# Patient Record
Sex: Male | Born: 1940 | Race: White | Hispanic: No | Marital: Married | Smoking: Never smoker
Health system: Southern US, Community
[De-identification: ages and names within clinical notes are randomized; demographics above are authoritative.]

## PROBLEM LIST (undated history)

## (undated) DIAGNOSIS — E785 Hyperlipidemia, unspecified: Secondary | ICD-10-CM

## (undated) DIAGNOSIS — E669 Obesity, unspecified: Secondary | ICD-10-CM

## (undated) DIAGNOSIS — I2510 Atherosclerotic heart disease of native coronary artery without angina pectoris: Secondary | ICD-10-CM

## (undated) DIAGNOSIS — E119 Type 2 diabetes mellitus without complications: Secondary | ICD-10-CM

## (undated) DIAGNOSIS — R0602 Shortness of breath: Secondary | ICD-10-CM

## (undated) HISTORY — DX: Obesity, unspecified: E66.9

## (undated) HISTORY — PX: CORONARY ARTERY BYPASS GRAFT: SHX141

## (undated) HISTORY — DX: Type 2 diabetes mellitus without complications: E11.9

## (undated) HISTORY — DX: Atherosclerotic heart disease of native coronary artery without angina pectoris: I25.10

## (undated) HISTORY — DX: Hyperlipidemia, unspecified: E78.5

## (undated) HISTORY — PX: HERNIA REPAIR: SHX51

## (undated) HISTORY — DX: Shortness of breath: R06.02

---

## 2010-03-19 ENCOUNTER — Ambulatory Visit (HOSPITAL_COMMUNITY)
Admission: RE | Admit: 2010-03-19 | Discharge: 2010-03-19 | Payer: Self-pay | Source: Home / Self Care | Attending: Cardiology | Admitting: Cardiology

## 2010-03-21 NOTE — Procedures (Signed)
[name], BILLER NO.:  [id number]  MEDICAL RECORD NO.:  [id number]          PATIENT TYPE:  OIB  LOCATION:  2899                         FACILITY:  [facility]  PHYSICIAN:  Jake Bathe, MD      DATE OF BIRTH:  [birth year]  DATE OF PROCEDURE:  03/19/2010 DATE OF DISCHARGE:                           CARDIAC CATHETERIZATION   PROCEDURE:  Left heart catheterization via the right radial artery approach.  Selective coronary angiography, left ventriculogram.  INDICATIONS:  A 70 year old male with prior cardiac catheterization in 1997 showing mild nonobstructive CAD who recently underwent a nuclear stress test showing a moderate-size moderately reversible defect in the inferior wall concerning for ischemia.  He exercised for 5 minutes and 35 seconds.  Occasional PVCs were noted.  Nonspecific ST-segment changes were noted.  In 3 minutes in the recovery,  his ST-segments, however, became horizontal and 1 mm in depression.  Symptoms have been increasing dyspnea on exertion while pulling his garbage can in for instance.  He also describes a burning sensation in his neck with exertion.  He is clearly winded when playing with his grandchildren.  Past medical history include obesity.  Medications are currently none.  Aspirin will be added as well as sublingual nitroglycerin.  PROCEDURE DETAILS:  Informed consent was obtained.  Risk of stroke, heart attack, death, renal impairment, arterial damage, and bleeding were explained to the patient at length.  Prepped and draped in sterile fashion.  1% lidocaine was used for local anesthesia to the right radial artery wrist site.  2 mg of Versed, 50 mcg of fentanyl were used for conscious sedation.  A 5-French hydrophilic sheath was inserted in the right radial artery without any difficulty.  A Wholey wire was then used to traverse the aortic arch with a Judkins right #4 catheter and 4000 units of heparin were administered.  3 mg  of verapamil was also administered via sheath.  Multiple views with hand injection of Omnipaque of the right coronary artery were obtained after selective cannulization.  This catheter was then exchanged over a safety J-wire for a Judkins left 3.5 catheter and multiple views with hand injection of Omnipaque were obtained of the left system.  An angled pigtail was used to cross in the left ventricle, and a left ventriculogram in the RAO position utilizing 30 mL of contrast was performed with power injection.  Following the procedure, sheath was removed, catheters were removed without difficulty, and he was hemodynamically stable.  No chest pain.  FINDINGS: 1. Right coronary artery - this vessel does demonstrate a mid highly     calcified lesion of up to 99% with a small ectatic/aneurysmal     segment proximal to the 99% lesion.  He also has approximately 50%     lesion prior to the ectatic segment.  There are minor luminal     irregularities until the split of the posterior descending artery.     The PDA fills late in injection and Marion Downer has what appears to be     a significant subtotaled lesion.  There are collaterals supplying     the PDA from  the left system. 2. Left main artery - widely patent.  No significant disease, branches     into the LAD and circumflex system. 3. LAD - there are two diagonal branches.  The first of which is     moderate size and caliber.  The first diagonal branch appears to     have an 80% ostial lesion.  In the mid LAD segment, there is a     region of calcification after a large septal branch that in the LAO     cranial view appears to be hemodynamically significant,     approximately 70%. 4. Circumflex artery.  This vessel has diffuse disease.  The ostial     circumflex has a lesion of approximately 80%, seen best on the LAO     caudal view.  There are two significant obtuse marginal branches.     The first of which is moderate-sized and caliber and  contains no     significant disease.  The second obtuse marginal branch is small in     caliber.  Between the first and second obtuse marginal branch,     there is a long tubular stenosis, likely subtotaled circumflex 99%.     Once again, RCA collaterals/PDA collaterals are seen from the     septal branches. 5. Left ventriculogram.  Normal left ventricular ejection fraction of     65% with no wall motion abnormalities.  I do not appreciate any     inferior wall motion abnormality on this study.  No significant     mitral regurgitation present.  HEMODYNAMICS:  Left ventricular systolic pressure 124 with an end- diastolic pressure of 11.  Aortic pressure 124/70 with a mean of 91 mmHg - no gradient.  IMPRESSION:  Severe three-vessel coronary artery disease, most notably right coronary artery/posterior descending artery with subtotaled posterior descending artery filling via right to right/left to right collaterals, subtotaled 99%.  Circumflex between the obtuse marginal 1 and 2, and a 70% mid left anterior descending lesion calcified at the area of a large septal branch.  These findings were discussed with patient as well as Dr. Katrinka Blazing and we felt that best chance at revascularization was with bypass surgery.  I have consulted [facility] and they will set him up for an outpatient consultation.  He is having no active chest pain.  No discomfort.  He tolerated the procedure very well.  I feel comfortable with him receiving an outpatient evaluation.  We will go ahead and place him on aspirin, give him nitroglycerin, and also place him on a low-dose beta blocker.     Jake Bathe, MD     MCS/MEDQ  D:  03/19/2010  T:  03/19/2010  Job:  [id number]  Electronically Signed by Donato Schultz MD on 03/21/2010 01:04:44 PM

## 2010-03-24 ENCOUNTER — Ambulatory Visit
Admission: RE | Admit: 2010-03-24 | Discharge: 2010-03-24 | Payer: Self-pay | Source: Home / Self Care | Attending: Surgery | Admitting: Surgery

## 2010-03-25 ENCOUNTER — Other Ambulatory Visit: Payer: Self-pay | Admitting: Surgery

## 2010-03-25 ENCOUNTER — Encounter (HOSPITAL_COMMUNITY)
Admission: RE | Admit: 2010-03-25 | Discharge: 2010-03-25 | Disposition: A | Discharge status: Home / Self Care | Payer: Medicare Other | Source: Ambulatory Visit | Attending: Surgery | Admitting: Surgery

## 2010-03-25 ENCOUNTER — Inpatient Hospital Stay (HOSPITAL_COMMUNITY)
Admission: RE | Admit: 2010-03-25 | Discharge: 2010-03-25 | Disposition: A | Discharge status: Home / Self Care | Payer: MEDICARE | Source: Ambulatory Visit | Attending: Surgery | Admitting: Surgery

## 2010-03-25 ENCOUNTER — Encounter (HOSPITAL_COMMUNITY): Payer: Medicare Other

## 2010-03-25 DIAGNOSIS — Z01818 Encounter for other preprocedural examination: Secondary | ICD-10-CM | POA: Insufficient documentation

## 2010-03-25 DIAGNOSIS — I2510 Atherosclerotic heart disease of native coronary artery without angina pectoris: Secondary | ICD-10-CM

## 2010-03-25 DIAGNOSIS — Z01810 Encounter for preprocedural cardiovascular examination: Secondary | ICD-10-CM

## 2010-03-25 NOTE — Consult Note (Signed)
NEW PATIENT CONSULTATION  [name], [name] DOB:  08-Nov-1940                                        March 24, 2010 CHART #:  [id number]  REFERRING PHYSICIAN:  Veverly Fells. Skains, MD  REASON FOR CONSULTATION:  Severe three-vessel coronary disease with markedly abnormal stress test.  CLINICAL HISTORY:  Assessed by Dr. Anne Fu, to evaluate the patient for consideration of coronary bypass graft surgery.  He is a 70 year old gentleman with a history of nonobstructive coronary disease by catheterization about 15 years ago who does not seek regular medical care.  According to his wife over the past several months, he has had progressive exertional dyspnea as well as a burning in his throat occurring with exertion.  He has noticed this with taking his trash cans out as well as playing baseball with his grandson.  He said he really was too concerned about the symptoms but his family made him come to the doctor.  He underwent a nuclear stress test recently showing moderate- sized reversible defect in the inferior wall concerning for ischemia. He underwent cardiac catheterization on March 19, 2010, which showed severe three-vessel coronary disease.  The right coronary artery had a calcified 99% midvessel stenosis.  This was a large dominant vessel that gave off a posterior descending and posterolateral branch.  The posterior descending itself appeared to have a high-grade ostial lesion with slow and delayed filling of that vessel.  The left main coronary artery was widely patent.  The LAD had two diagonal branches that were small.  The first diagonal had about 80% ostial stenosis.  The LAD had a 70% midvessel stenosis.  Left circumflex was diffusely diseased and a relatively small vessel.  There is 80% ostial narrowing.  There were two obtuse marginal branches.  The first was a small and moderate-sized vessel that quickly decreased in size.  He had no significant  disease. The second marginal was small and there was a long tubular stenosis of about 99% between the two marginal branches.  Left ventricular ejection fraction of 65% with no wall motion abnormalities.  There was no mitral regurgitation.  REVIEW OF SYSTEMS:  As follows: GENERAL:  He denies any fever or chills.  He has had no recent weight changes.  He denies fatigue, although he said he has been decreasing activity due to his symptoms. EYES:  Negative. ENT:  Negative. ENDOCRINE:  He denies diabetes and hypothyroidism. CARDIOVASCULAR:  As above.  He denies PND and orthopnea.  He has had no peripheral edema or palpitations. RESPIRATORY:  He denies cough and sputum production. GI:  He has had no nausea or vomiting.  He denies any melena or bright red blood per rectum. GU:  He denies dysuria and hematuria. MUSCULOSKELETAL:  He denies arthralgias and myalgias. NEUROLOGICAL:  He has had some headaches.  He denies any focal weakness or numbness.  Denies dizziness and syncope.  He has never had TIA or stroke. PSYCHIATRIC:  Negative. HEMATOLOGICAL:  Negative.  ALLERGIES:  None.  His medications are aspirin 325 mg daily and sublingual nitroglycerin p.Haley.n.  His past medical history is significant for prior hernia repair surgery. He denies any other medical illnesses.  He did have a stress test about 15 years ago which he said was abnormal and he subsequently had a cardiac catheterization in 1997, which showed no significant coronary  disease.  He reported about 20% narrowing in one vessel.  He was subsequently followed by Dr. Corliss Marcus until recently.  Family history is negative for cardiac disease.  There is a history of lung cancer in his father who is a smoker.  SOCIAL HISTORY:  He was a previous smoker, who quit in 1974.  He denies alcohol use.  He is retired.  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 He is  married and lives with his wife.  PHYSICAL EXAMINATION:  General:  He is a well-developed white male in no distress.  Vital Signs:  Blood pressure 160/80, pulse 60 and regular, respiratory rate is 20 and unlabored.  Oxygen saturation on room air is 97%.  HEENT exam shows to be normocephalic and atraumatic.  Pupils are equal and reactive to light and accommodation.  Extraocular muscles are intact.  His throat is clear.  Neck exam shows normal carotid pulses bilaterally.  There are no bruits.  There is no adenopathy or thyromegaly.  Cardiac exam shows regular rate and rhythm with normal S1 and S2.  There is no murmur, rub, or gallop.  His lungs are clear. Abdominal exam shows active bowel sounds.  His abdomen is soft, obese, and nontender.  There are no palpable masses or organomegaly. Extremities exam show no peripheral edema.  Pedal pulses are palpable bilaterally.  Skin is warm and dry.  Neurologic exam shows be alert and oriented x3.  Motor and sensory exam is grossly normal.  IMPRESSION:  The patient has severe three-vessel coronary artery disease with worsening exertional anginal symptoms.  I agree that coronary artery bypass graft surgery is the best treatment to prevent further ischemia and infarction and improves the quality of life.  I discussed the operative procedure with he and his wife including alternatives, benefits, and risks including, but not limited to bleeding, blood transfusion, infection, stroke, myocardial infarction, graft failure, and death.  He understands all of this and agrees to proceed.  We will plan to do this on Friday, March 27, 2010.  Evelene Croon, M.D. Electronically Signed  BB/MEDQ  D:  03/24/2010  T:  03/25/2010  Job:  [id number]  cc:   Jake Bathe, MD

## 2010-03-27 ENCOUNTER — Inpatient Hospital Stay (HOSPITAL_COMMUNITY): Payer: Medicare Other

## 2010-03-27 ENCOUNTER — Inpatient Hospital Stay (HOSPITAL_COMMUNITY)
Admission: RE | Admit: 2010-03-27 | Discharge: 2010-03-31 | DRG: 236 | Disposition: A | Discharge status: Home Health | Payer: Medicare Other | Source: Ambulatory Visit | Attending: Surgery | Admitting: Surgery

## 2010-03-27 DIAGNOSIS — N289 Disorder of kidney and ureter, unspecified: Secondary | ICD-10-CM | POA: Diagnosis present

## 2010-03-27 DIAGNOSIS — I2510 Atherosclerotic heart disease of native coronary artery without angina pectoris: Secondary | ICD-10-CM

## 2010-03-27 DIAGNOSIS — D62 Acute posthemorrhagic anemia: Secondary | ICD-10-CM | POA: Diagnosis not present

## 2010-03-27 DIAGNOSIS — I10 Essential (primary) hypertension: Secondary | ICD-10-CM | POA: Diagnosis present

## 2010-03-27 DIAGNOSIS — D696 Thrombocytopenia, unspecified: Secondary | ICD-10-CM | POA: Diagnosis not present

## 2010-03-27 DIAGNOSIS — Z7982 Long term (current) use of aspirin: Secondary | ICD-10-CM

## 2010-03-27 DIAGNOSIS — Z87891 Personal history of nicotine dependence: Secondary | ICD-10-CM

## 2010-03-27 LAB — CBC
HCT: 38.3 % — ABNORMAL LOW (ref 39.0–52.0)
HCT: 30.3 % — ABNORMAL LOW (ref 39.0–52.0)
Hemoglobin: 13.3 g/dL (ref 13.0–17.0)
Hemoglobin: 10.4 g/dL — ABNORMAL LOW (ref 13.0–17.0)
MCH: 28.5 pg (ref 26.0–34.0)
MCHC: 34.7 g/dL (ref 30.0–36.0)
MCHC: 33.8 g/dL (ref 30.0–36.0)
MCV: 84 fL (ref 78.0–100.0)
MCV: 84.3 fL (ref 78.0–100.0)
Platelets: 128 10*3/uL — ABNORMAL LOW (ref 150–400)
RDW: 13.6 % (ref 11.5–15.5)
WBC: 4.5 10*3/uL (ref 4.0–10.5)
WBC: 6.6 10*3/uL (ref 4.0–10.5)

## 2010-03-27 LAB — POCT I-STAT 3, ART BLOOD GAS (G3+)
Bicarbonate: 26.4 mEq/L — ABNORMAL HIGH (ref 20.0–24.0)
Bicarbonate: 24.3 mEq/L — ABNORMAL HIGH (ref 20.0–24.0)
O2 Saturation: 100 %
O2 Saturation: 100 %
TCO2: 28 mmol/L (ref 0–100)
pCO2 arterial: 39.3 mmHg (ref 35.0–45.0)
pCO2 arterial: 48.3 mmHg — ABNORMAL HIGH (ref 35.0–45.0)
pH, Arterial: 7.37 (ref 7.350–7.450)
pH, Arterial: 7.373 (ref 7.350–7.450)
pH, Arterial: 7.353 (ref 7.350–7.450)
pO2, Arterial: 92 mmHg (ref 80.0–100.0)
pO2, Arterial: 104 mmHg — ABNORMAL HIGH (ref 80.0–100.0)

## 2010-03-27 LAB — APTT
aPTT: 29 seconds (ref 24–37)
aPTT: 33 seconds (ref 24–37)

## 2010-03-27 LAB — COMPREHENSIVE METABOLIC PANEL
ALT: 54 U/L — ABNORMAL HIGH (ref 0–53)
Alkaline Phosphatase: 58 U/L (ref 39–117)
BUN: 17 mg/dL (ref 6–23)
CO2: 22 mEq/L (ref 19–32)
Calcium: 9.2 mg/dL (ref 8.4–10.5)
GFR calc non Af Amer: >60 mL/min (ref >60)
Glucose, Bld: 121 mg/dL — ABNORMAL HIGH (ref 70–99)
Potassium: 4.3 mEq/L (ref 3.5–5.1)
Sodium: 138 mEq/L (ref 135–145)
Total Protein: 7.2 g/dL (ref 6.0–8.3)

## 2010-03-27 LAB — BLOOD GAS, ARTERIAL
Acid-Base Excess: 0.3 mmol/L (ref 0.0–2.0)
Bicarbonate: 24.8 mEq/L — ABNORMAL HIGH (ref 20.0–24.0)
Patient temperature: 98.6
TCO2: 26.2 mmol/L (ref 0–100)

## 2010-03-27 LAB — GLUCOSE, CAPILLARY
Glucose-Capillary: 126 mg/dL — ABNORMAL HIGH (ref 70–99)
Glucose-Capillary: 124 mg/dL — ABNORMAL HIGH (ref 70–99)
Glucose-Capillary: 112 mg/dL — ABNORMAL HIGH (ref 70–99)
Glucose-Capillary: 131 mg/dL — ABNORMAL HIGH (ref 70–99)
Glucose-Capillary: 146 mg/dL — ABNORMAL HIGH (ref 70–99)

## 2010-03-27 LAB — POCT I-STAT 4, (NA,K, GLUC, HGB,HCT)
Glucose, Bld: 132 mg/dL — ABNORMAL HIGH (ref 70–99)
Glucose, Bld: 115 mg/dL — ABNORMAL HIGH (ref 70–99)
Glucose, Bld: 139 mg/dL — ABNORMAL HIGH (ref 70–99)
Glucose, Bld: 152 mg/dL — ABNORMAL HIGH (ref 70–99)
HCT: 26 % — ABNORMAL LOW (ref 39.0–52.0)
HCT: 27 % — ABNORMAL LOW (ref 39.0–52.0)
HCT: 26 % — ABNORMAL LOW (ref 39.0–52.0)
Hemoglobin: 11.9 g/dL — ABNORMAL LOW (ref 13.0–17.0)
Hemoglobin: 8.8 g/dL — ABNORMAL LOW (ref 13.0–17.0)
Hemoglobin: 9.2 g/dL — ABNORMAL LOW (ref 13.0–17.0)
Hemoglobin: 9.2 g/dL — ABNORMAL LOW (ref 13.0–17.0)
Hemoglobin: 10.9 g/dL — ABNORMAL LOW (ref 13.0–17.0)
Potassium: 5.5 mEq/L — ABNORMAL HIGH (ref 3.5–5.1)
Potassium: 6.4 mEq/L (ref 3.5–5.1)
Potassium: 7 mEq/L (ref 3.5–5.1)
Potassium: 5.5 mEq/L — ABNORMAL HIGH (ref 3.5–5.1)
Sodium: 141 mEq/L (ref 135–145)
Sodium: 138 mEq/L (ref 135–145)
Sodium: 134 mEq/L — ABNORMAL LOW (ref 135–145)
Sodium: 140 mEq/L (ref 135–145)

## 2010-03-27 LAB — POCT I-STAT, CHEM 8
BUN: 17 mg/dL (ref 6–23)
Calcium, Ion: 1.11 mmol/L — ABNORMAL LOW (ref 1.12–1.32)
Creatinine, Ser: 1.1 mg/dL (ref 0.4–1.5)
Glucose, Bld: 163 mg/dL — ABNORMAL HIGH (ref 70–99)
Hemoglobin: 10.9 g/dL — ABNORMAL LOW (ref 13.0–17.0)
TCO2: 24 mmol/L (ref 0–100)

## 2010-03-27 LAB — HEMOGLOBIN AND HEMATOCRIT, BLOOD
HCT: 25.8 % — ABNORMAL LOW (ref 39.0–52.0)
Hemoglobin: 9.1 g/dL — ABNORMAL LOW (ref 13.0–17.0)

## 2010-03-27 LAB — TYPE AND SCREEN
ABO/RH(D): A NEG
ABO/RH(D): A NEG
Antibody Screen: NEGATIVE
Unit division: 0

## 2010-03-27 LAB — URINALYSIS, ROUTINE W REFLEX MICROSCOPIC
Bilirubin Urine: NEGATIVE
Hgb urine dipstick: NEGATIVE
Ketones, ur: NEGATIVE mg/dL
Nitrite: NEGATIVE
Specific Gravity, Urine: 1.021 (ref 1.005–1.030)
Urobilinogen, UA: 0.2 mg/dL (ref 0.0–1.0)

## 2010-03-27 LAB — CREATININE, SERUM: GFR calc Af Amer: >60 mL/min (ref >60)

## 2010-03-27 LAB — MAGNESIUM: Magnesium: 2.6 mg/dL — ABNORMAL HIGH (ref 1.5–2.5)

## 2010-03-27 LAB — PROTIME-INR
INR: 1.03 (ref 0.00–1.49)
INR: 1.31 (ref 0.00–1.49)

## 2010-03-27 LAB — PLATELET COUNT: Platelets: 120 10*3/uL — ABNORMAL LOW (ref 150–400)

## 2010-03-28 ENCOUNTER — Inpatient Hospital Stay (HOSPITAL_COMMUNITY): Payer: Medicare Other

## 2010-03-28 LAB — GLUCOSE, CAPILLARY
Glucose-Capillary: 135 mg/dL — ABNORMAL HIGH (ref 70–99)
Glucose-Capillary: 150 mg/dL — ABNORMAL HIGH (ref 70–99)
Glucose-Capillary: 156 mg/dL — ABNORMAL HIGH (ref 70–99)
Glucose-Capillary: 174 mg/dL — ABNORMAL HIGH (ref 70–99)

## 2010-03-28 LAB — CBC
HCT: 29.4 % — ABNORMAL LOW (ref 39.0–52.0)
MCH: 28.8 pg (ref 26.0–34.0)
MCH: 29.1 pg (ref 26.0–34.0)
MCHC: 34 g/dL (ref 30.0–36.0)
MCV: 85.5 fL (ref 78.0–100.0)
Platelets: 138 10*3/uL — ABNORMAL LOW (ref 150–400)
Platelets: 113 10*3/uL — ABNORMAL LOW (ref 150–400)
RBC: 3.44 MIL/uL — ABNORMAL LOW (ref 4.22–5.81)
RDW: 14 % (ref 11.5–15.5)
WBC: 8.8 10*3/uL (ref 4.0–10.5)

## 2010-03-28 LAB — BASIC METABOLIC PANEL
BUN: 19 mg/dL (ref 6–23)
Chloride: 107 mEq/L (ref 96–112)
Creatinine, Ser: 1.47 mg/dL (ref 0.4–1.5)
Glucose, Bld: 185 mg/dL — ABNORMAL HIGH (ref 70–99)
Potassium: 4.7 mEq/L (ref 3.5–5.1)

## 2010-03-28 LAB — CREATININE, SERUM
Creatinine, Ser: 1.44 mg/dL (ref 0.4–1.5)
GFR calc Af Amer: 59 mL/min — ABNORMAL LOW (ref >60)

## 2010-03-29 ENCOUNTER — Inpatient Hospital Stay (HOSPITAL_COMMUNITY): Payer: Medicare Other

## 2010-03-29 ENCOUNTER — Encounter (HOSPITAL_COMMUNITY): Payer: Self-pay | Admitting: Radiology

## 2010-03-29 DIAGNOSIS — IMO0001 Reserved for inherently not codable concepts without codable children: Secondary | ICD-10-CM

## 2010-03-29 DIAGNOSIS — E1165 Type 2 diabetes mellitus with hyperglycemia: Secondary | ICD-10-CM

## 2010-03-29 LAB — BASIC METABOLIC PANEL
Chloride: 99 mEq/L (ref 96–112)
GFR calc non Af Amer: 59 mL/min — ABNORMAL LOW (ref >60)
Glucose, Bld: 144 mg/dL — ABNORMAL HIGH (ref 70–99)
Potassium: 4.3 mEq/L (ref 3.5–5.1)
Sodium: 135 mEq/L (ref 135–145)

## 2010-03-29 LAB — GLUCOSE, CAPILLARY
Glucose-Capillary: 152 mg/dL — ABNORMAL HIGH (ref 70–99)
Glucose-Capillary: 134 mg/dL — ABNORMAL HIGH (ref 70–99)

## 2010-03-29 LAB — CBC
HCT: 28.4 % — ABNORMAL LOW (ref 39.0–52.0)
RBC: 3.29 MIL/uL — ABNORMAL LOW (ref 4.22–5.81)
RDW: 14.1 % (ref 11.5–15.5)
WBC: 6.5 10*3/uL (ref 4.0–10.5)

## 2010-03-30 LAB — CBC
HCT: 26.7 % — ABNORMAL LOW (ref 39.0–52.0)
Hemoglobin: 8.7 g/dL — ABNORMAL LOW (ref 13.0–17.0)
MCH: 28.4 pg (ref 26.0–34.0)
MCHC: 32.6 g/dL (ref 30.0–36.0)

## 2010-03-30 LAB — BASIC METABOLIC PANEL
CO2: 27 mEq/L (ref 19–32)
Calcium: 8.1 mg/dL — ABNORMAL LOW (ref 8.4–10.5)
Creatinine, Ser: 1.09 mg/dL (ref 0.4–1.5)
Glucose, Bld: 113 mg/dL — ABNORMAL HIGH (ref 70–99)

## 2010-03-30 LAB — GLUCOSE, CAPILLARY: Glucose-Capillary: 118 mg/dL — ABNORMAL HIGH (ref 70–99)

## 2010-03-31 LAB — GLUCOSE, CAPILLARY
Glucose-Capillary: 109 mg/dL — ABNORMAL HIGH (ref 70–99)
Glucose-Capillary: 122 mg/dL — ABNORMAL HIGH (ref 70–99)

## 2010-04-04 NOTE — Op Note (Signed)
[name], [name] NO.:  [id number]  MEDICAL RECORD NO.:  [id number]           PATIENT TYPE:  I  LOCATION:  2304                         FACILITY:  [facility]  PHYSICIAN:  Evelene Croon, M.D.     DATE OF BIRTH:  [birth year]  DATE OF PROCEDURE:  03/27/2010 DATE OF DISCHARGE:                              OPERATIVE REPORT   PREOPERATIVE DIAGNOSIS:  Severe three-vessel coronary disease.  POSTOPERATIVE DIAGNOSIS:  Severe three-vessel coronary disease.  OPERATIVE PROCEDURES:  Median sternotomy, extracorporeal circulation, coronary artery bypass graft surgery x4 using a left internal mammary artery graft to left anterior descending coronary artery, with a sequential saphenous vein graft to the posterior descending and posterolateral branches of the right coronary artery, and a saphenous vein graft to the first diagonal branch of the left anterior descending. Endoscopic vein harvesting from both legs.  ATTENDING SURGEON:  Evelene Croon, MD  ASSISTANT:  Salvatore Decent. Dorris Fetch, MD  SECOND ASSISTANT:  Stephanie Acre. Dasovich, PA-C  ANESTHESIA:  General endotracheal.  CLINICAL HISTORY:  This patient is a 70 year old gentleman with history of nonobstructive coronary disease by catheterization about 15 years ago who does not seek regular medical care.  Over the past several months, he has had progressive exertional dyspnea as well as some burning in his throat occurring with exertion.  He underwent a nuclear stress test showing a moderate-sized reversible defect in the inferior wall concerning for ischemia.  He underwent cardiac catheterization on March 19, 2010 which showed severe three-vessel disease.  The right coronary artery had a calcified 99% midvessel stenosis and was a large dominant vessel that gave off posterior descending and posterolateral branch.  The posterior descending itself had a high-grade ostial stenosis with slow and delayed filling of that vessel.   The left main coronary artery was widely patent.  The LAD had two diagonal branches that were small.  The first diagonal had about 80% ostial stenosis.  The LAD itself had about 70% midvessel stenosis.  The left circumflex was diffusely diseased and a relatively small vessel.  There was 80% ostial narrowing.  There were two obtuse marginal branches.  The first was small to moderate-sized vessel that quickly decreased in size on the lateral wall.  It had no significant disease.  The second marginal was small.  There was a long tubular stenosis of about 99% between the two marginal branches.  Left ventricular ejection fraction was 65% with no wall motion abnormalities.  There was no mitral regurgitation.  After reviewing the catheterization and examination of the patient, it was felt that coronary artery bypass graft surgery was the best treatment to prevent further ischemia and infarction and improve his quality of life. I discussed the operative procedure with the patient and his wife including alternatives, benefits, and risks including but not limited to bleeding, blood transfusion, infection, stroke, myocardial infarction, graft failure, and death.  He understood all this and agreed to proceed.  OPERATIVE PROCEDURE:  The patient was taken to the operating room and placed on table in supine position.  After induction of general endotracheal anesthesia, a Foley catheter was placed in bladder using  sterile technique.  Then the chest, abdomen, and both lower extremities were prepped and draped in usual sterile manner.  The chest was entered through a median sternotomy incision.  The pericardium was opened in the midline.  Examination of the heart showed good ventricular contractility.  The ascending aorta had no palpable plaques in it.  Then the left internal mammary artery was harvested from the chest wall as a pedicle graft.  This was a medium caliber vessel with excellent blood flow.   At the same time, a segment of greater saphenous vein was harvested from the right leg.  This was harvested endoscopically and was of medium size that had thickened walls and thought that was suboptimal. Therefore, I harvested another section of saphenous vein from the left thigh endoscopically and this vein was also of medium size.  This vein had thickened walls but was of slightly better quality than the right leg.  Then the patient was heparinized when adequate ACT was obtained.  The distal ascending aorta was cannulated using a 20-French aortic cannula for arterial inflow.  Venous outflow was achieved using a two-stage venous cannula through the right atrial appendage.  An antegrade cardioplegia and vent cannula was inserted into the aortic root.  The patient was placed on cardiopulmonary bypass and distal coronaries were identified.  The LAD was a large graftable vessel and had some segmental disease in the proximal and mid vessel.  The first diagonal was small but graftable.  It had no distal disease in it.  The obtuse marginal was a small diffusely diseased vessel.  I felt this was not suitable for grafting given the thick vein that was available.  The distal left circumflex was also very small and diffusely diseased and not graftable.  The right coronary artery gave off moderate-sized posterior descending and posterolateral branches both of which were graftable.  The posterior descending branch did have a segmental disease extending out into the distal vessel.  Then the aorta was crossclamped and 1000 mL of cold blood antegrade cardioplegia was administered in the aortic root with quick arrest of the heart.  Systemic hypothermia to 20 degrees centigrade and topical hypothermia with iced saline was used.  Temperature probe was placed in septum and insulating pad in the pericardium.  First distal anastomosis was performed to the posterior descending coronary artery.  The  internal diameter was about 1.75 mm.  Conduit used was a segment of greater saphenous vein and anastomosis performed in a sequential side-to-side manner using continuous 7-0 Prolene suture. Flow was noted through the graft and was excellent.  The second distal anastomosis was performed to the posterolateral branch.  The internal diameter of this vessel was also about 1.75 mm. The conduit used was the same segment of greater saphenous vein.  The anastomosis was performed in a sequential end-to-side manner using continuous 7-0 Prolene suture.  Flow was noted through the graft and was excellent.  Then another dose of cardioplegia was given down the vein graft and the aortic root.  The third distal anastomosis was performed to the diagonal branch.  The internal diameter of this vessel was 1.5 mm.  Conduit used was a second segment of greater saphenous vein and the anastomosis performed in an end-to-side manner using continuous 7-0 Prolene suture.  Flow was noted through the graft and was excellent.  The fourth distal anastomosis was performed to the mid-to-distal LAD. The internal diameter of this vessel was about 1.75 mm.  The conduit used was a left  internal mammary graft.  This was brought through an opening in the left pericardium anterior to the phrenic nerve.  It was anastomosed to the LAD in an end-to-side manner using continuous 8-0 Prolene suture.  The pedicle was sutured to the epicardium using 6-0 Prolene sutures.  Then another dose of cardioplegia was given.  With crossclamp in place, the two proximal vein graft anastomosis were performed.  The mid ascending aorta in an end-to-side manner using continuous 6-0 Prolene suture.  Then the clamp was removed from the mammary pedicle.  There was rapid warming of the ventricular septum and return of spontaneous ventricular fibrillation.  The crossclamp was removed with a time of 69 minutes and there was spontaneous return of  ventricular fibrillation. The patient spontaneously converted to sinus rhythm.  The proximal and distal anastomoses appeared hemostatic and all the grafts satisfactory. A graft marker was placed around the proximal anastomoses.  Two temporary right ventricular and right atrial pacing wires were placed and brought out through the skin.  The patient was rewarmed to 37 degrees centigrade.  He was weaned from cardiopulmonary bypass on no inotropic agents.  Total bypass time was 85 minutes.  Cardiac function appeared excellent with cardiac output of 7 L/minute.  Protamine was given and the venous and aortic cannulae were removed without difficulty.  Hemostasis was achieved.  Three chest tubes were placed with two in the posterior pericardium, one in left pleural space, and one in the anterior mediastinum.  The sternum was then closed with double #6 stainless steel wires.  The fascia was closed with continuous #1 Vicryl suture.  Subcutaneous tissues were closed with continuous 2-0 Vicryl and the skin with 3-0 Vicryl subcuticular closure. The lower extremity vein harvest sites were closed in layers in similar manner.  Sponge, needle, and instrument counts were correct according to the scrub nurse.  Dry sterile dressing was applied over the incisions around the chest tubes which were left for Pleur-Evac suction.  The patient remained hemodynamically stable and was transferred to the SICU in guarded but stable condition.     Evelene Croon, M.D.     BB/MEDQ  D:  03/27/2010  T:  03/28/2010  Job:  [id number]  cc:   Jake Bathe, MD  Electronically Signed by Evelene Croon M.D. on 03/30/2010 08:25:40 AM

## 2010-04-13 NOTE — Discharge Summary (Signed)
NAMECARMELO, [name] NO.:  [id number]  MEDICAL RECORD NO.:  [id number]           PATIENT TYPE:  I  LOCATION:  2017                         FACILITY:  [facility]  PHYSICIAN:  Evelene Croon, M.D.     DATE OF BIRTH:  19-Jun-1940  DATE OF ADMISSION:  03/27/2010 DATE OF DISCHARGE:  03/31/2010                              DISCHARGE SUMMARY   PRIMARY ADMITTING DIAGNOSIS:  Severe 3-vessel coronary artery disease.  ADDITIONAL/DISCHARGE DIAGNOSES: 1. Severe 3-vessel coronary artery disease. 2. Remote history of tobacco abuse. 3. Postoperative mild renal insufficiency, resolved. 4. Acute blood loss anemia postoperatively. 5. Perioperative hyperglycemia with baseline hemoglobin A1c 6.8.  PROCEDURES PERFORMED: 1. Coronary artery bypass grafting x4 (left internal mammary artery to     the LAD, saphenous vein graft to the first diagonal, sequential     saphenous vein graft to the posterior descending, and     posterolateral branches in the right coronary artery). 2. Endoscopic vein harvest, bilateral lower extremities.  HISTORY:  The patient is a 70 year old male with no significant past medical history.  He apparently had a stress test about 15 years ago which was reportedly abnormal and states that he had cardiac catheterization which showed no significant coronary artery disease.  He has been in his usual state of health until the past several months.  He has began to develop progressive exertional dyspnea which is associated with a burning sensation in his throat.  Since his symptoms had been persistent, his family encouraged him to follow up with his physician and he subsequently underwent a nuclear stress test which showed a moderate size reversible defect in the inferior wall concerning for ischemia.  He then underwent cardiac catheterization by Dr. Anne Fu on March 19, 2010, which showed severe 3-vessel coronary artery disease including a calcified 99% mid vessel  right coronary stenosis, high-grade ostial lesion of the posterior descending, an 80% ostial first diagonal, a 70% mid LAD stenosis, diffuse disease in the left circumflex with an 80% ostial narrowing, and a long tubular stenosis around 99% between 2 obtuse marginal branches.  Left ventricular ejection fraction was 65% with no wall motion abnormalities and no mitral regurgitation.  Because of his symptoms and his severe 3-vessel disease, he was referred to Dr. Evelene Croon for consideration of surgical revascularization.  Dr. Laneta Simmers saw the patient, reviewed his films, and agreed with the need for CABG.  He explained all risks, benefits, and alternatives to the patient and he agreed to proceed.  HOSPITAL COURSE:  [name] was admitted to [facility] on March 27, 2010, and was taken to the operating room where he underwent CABG x4.  Please see previously dictated operative report for complete details of surgery.  He tolerated the procedure well and was transferred to the SICU in stable condition.  He was extubated shortly after surgery.  He remained hemodynamically stable and was able to be transferred to the step-down unit on postop day #1.  He did initially have some bradycardia requiring pacing, but his pacer was weaned and discontinued by postop day #2.  He has been started on a low-dose beta  blocker and is tolerating this without problem.  He also had a mild bump in his creatinine on postop day #1 up to 1.5.  This has been monitored closely and has been trending back down to baseline.  He has been somewhat volume overloaded postoperatively and has been started on Lasix to which he is responding well.  He has also had an acute blood loss anemia which has not required transfusion and for which he is being treated with iron supplementation.  He currently is afebrile and his vital signs have been stable.  He is ambulating well with cardiac rehab phase 1, although he does  still require supplemental oxygen with ambulation as he desats down into the 85-90% range on room air with exertion.  He will continue to work on aggressive pulmonary toilet measures and we will wean his oxygen over the next 24-48 hours.  His incisions are all healing well.  He remains somewhat edematous on physical exam and about 3 kg above his preoperative weight.  His perioperative blood sugars have been elevated, running in the 121-130 range.  His baseline hemoglobin A1c was 6.8 and we will have him followup with his primary care physician as an outpatient to monitor his blood sugars long term.  His most recent labs on postop day #3 showed hemoglobin of 8.7, hematocrit 26.7, white count 5.2, platelets 106, sodium 136, potassium 4.6, BUN 22, creatinine 1.09.  His latest chest x- ray shows minimal bibasilar atelectasis and small left pleural effusion. Overall, he is progressing well.  We will continue to monitor his pulmonary status and attempt to wean completely from supplemental oxygen.  If he remains stable and no other acute changes occur, we anticipate discharge home within the next 24-48 hours.  DISCHARGE MEDICATIONS: 1. Lasix 40 mg daily x7 days. 2. Nu-Iron 150 mg daily. 3. Oxycodone IR 5-10 mg q.3-4 hours p.r.n. for pain. 4. Potassium 20 mEq daily x7 days. 5. Zocor 20 mg daily. 6. Lopressor 12.5 mg b.i.d. 7. Enteric-coated aspirin 325 mg daily.  DISCHARGE INSTRUCTIONS:  He was asked to refrain from driving, heavy lifting, or strenuous activity.  He may continue ambulating daily and using his incentive spirometer.  He may shower daily and clean his incisions with soap and water.  He will continue a low-fat, low-sodium diet.  DISCHARGE FOLLOWUP:  The patient will need to see Dr. Anne Fu in 2 weeks for Cardiology followup.  He will see Dr. Laneta Simmers in 3 weeks with a chest x-ray.  He will also need to follow up with his primary care physician in the next 1-2 weeks to recheck  his blood sugars.  In the interim, if he experiences any problems or has questions, he is asked to contact our office immediately.     Coral Ceo, P.A.   ______________________________ Evelene Croon, M.D.    GC/MEDQ  D:  03/30/2010  T:  03/31/2010  Job:  [id number]  cc:   Jake Bathe, MD [facility] Office  Electronically Signed by Weldon Inches. on 04/03/2010 09:44:16 AM Electronically Signed by Evelene Croon M.D. on 04/13/2010 01:32:00 PM

## 2010-04-20 ENCOUNTER — Other Ambulatory Visit: Payer: Self-pay | Admitting: Surgery

## 2010-04-20 DIAGNOSIS — I2510 Atherosclerotic heart disease of native coronary artery without angina pectoris: Secondary | ICD-10-CM

## 2010-04-21 ENCOUNTER — Encounter (INDEPENDENT_AMBULATORY_CARE_PROVIDER_SITE_OTHER): Payer: Self-pay | Admitting: Surgery

## 2010-04-21 ENCOUNTER — Ambulatory Visit
Admission: RE | Admit: 2010-04-21 | Discharge: 2010-04-21 | Disposition: A | Discharge status: Home / Self Care | Payer: Medicare Other | Source: Ambulatory Visit | Attending: Surgery | Admitting: Surgery

## 2010-04-21 DIAGNOSIS — I2510 Atherosclerotic heart disease of native coronary artery without angina pectoris: Secondary | ICD-10-CM

## 2010-04-21 NOTE — Assessment & Plan Note (Signed)
OFFICE VISIT  MILLAN, LEGAN DOB:  November 22, 1940                                        April 21, 2010 CHART #:  [id number]  The patient returned to my office today for followup status post coronary artery bypass graft surgery x4 on March 27, 2010.  His postoperative course has been uncomplicated.  Since discharge, he has been feeling well and is walking daily without chest pain or shortness of breath.  On physical examination, his blood pressure is 122/84, his pulse is 75 and regular, respiratory rate is 20 and unlabored.  Oxygen saturation on room air is 98%.  He looks well.  Cardiac exam shows regular rate and rhythm with normal heart sounds.  His lung exam is clear.  The chest incision is healing well and the sternum is stable.  His chest tube sites are healing well and the Steri-Strips are removed.  His right leg incision is healing well.  There is no peripheral edema.  Follow up chest x-ray shows clear lung fields and no pleural effusions.  His medications are: 1. Enteric-coated aspirin 325 mg daily. 2. Lopressor 12.5 mg b.i.d. 3. Zocor 20 mg daily. 4. Oxycodone p.r.n. for pain. 5. Nu-Iron 150 mg daily.  I told him he could discontinue his iron,     since he has completed that prescription.  IMPRESSION:  Overall, the patient appears to be doing well following his surgery.  He seems very motivated to continue increasing his activity. He is planning on participating in cardiac rehab.  I told him he could return to driving a car which should refrain lifting anything heavier than 10 pounds for total of 3 months from date of surgery.  He will continue to follow up with Dr. Anne Fu and will contact me if he develops any problems with his incisions.  Evelene Croon, M.D. Electronically Signed  BB/MEDQ  D:  04/21/2010  T:  04/21/2010  Job:  [id number]  cc:   Jake Bathe, MD

## 2012-10-07 IMAGING — CR DG CHEST 2V
2 series · 2 of 2 positions shown · non-contrast
Comparison: None.

CLINICAL DATA: Preop.  CABG.

CHEST - 2 VIEW

[view not recorded (1 of 2)]
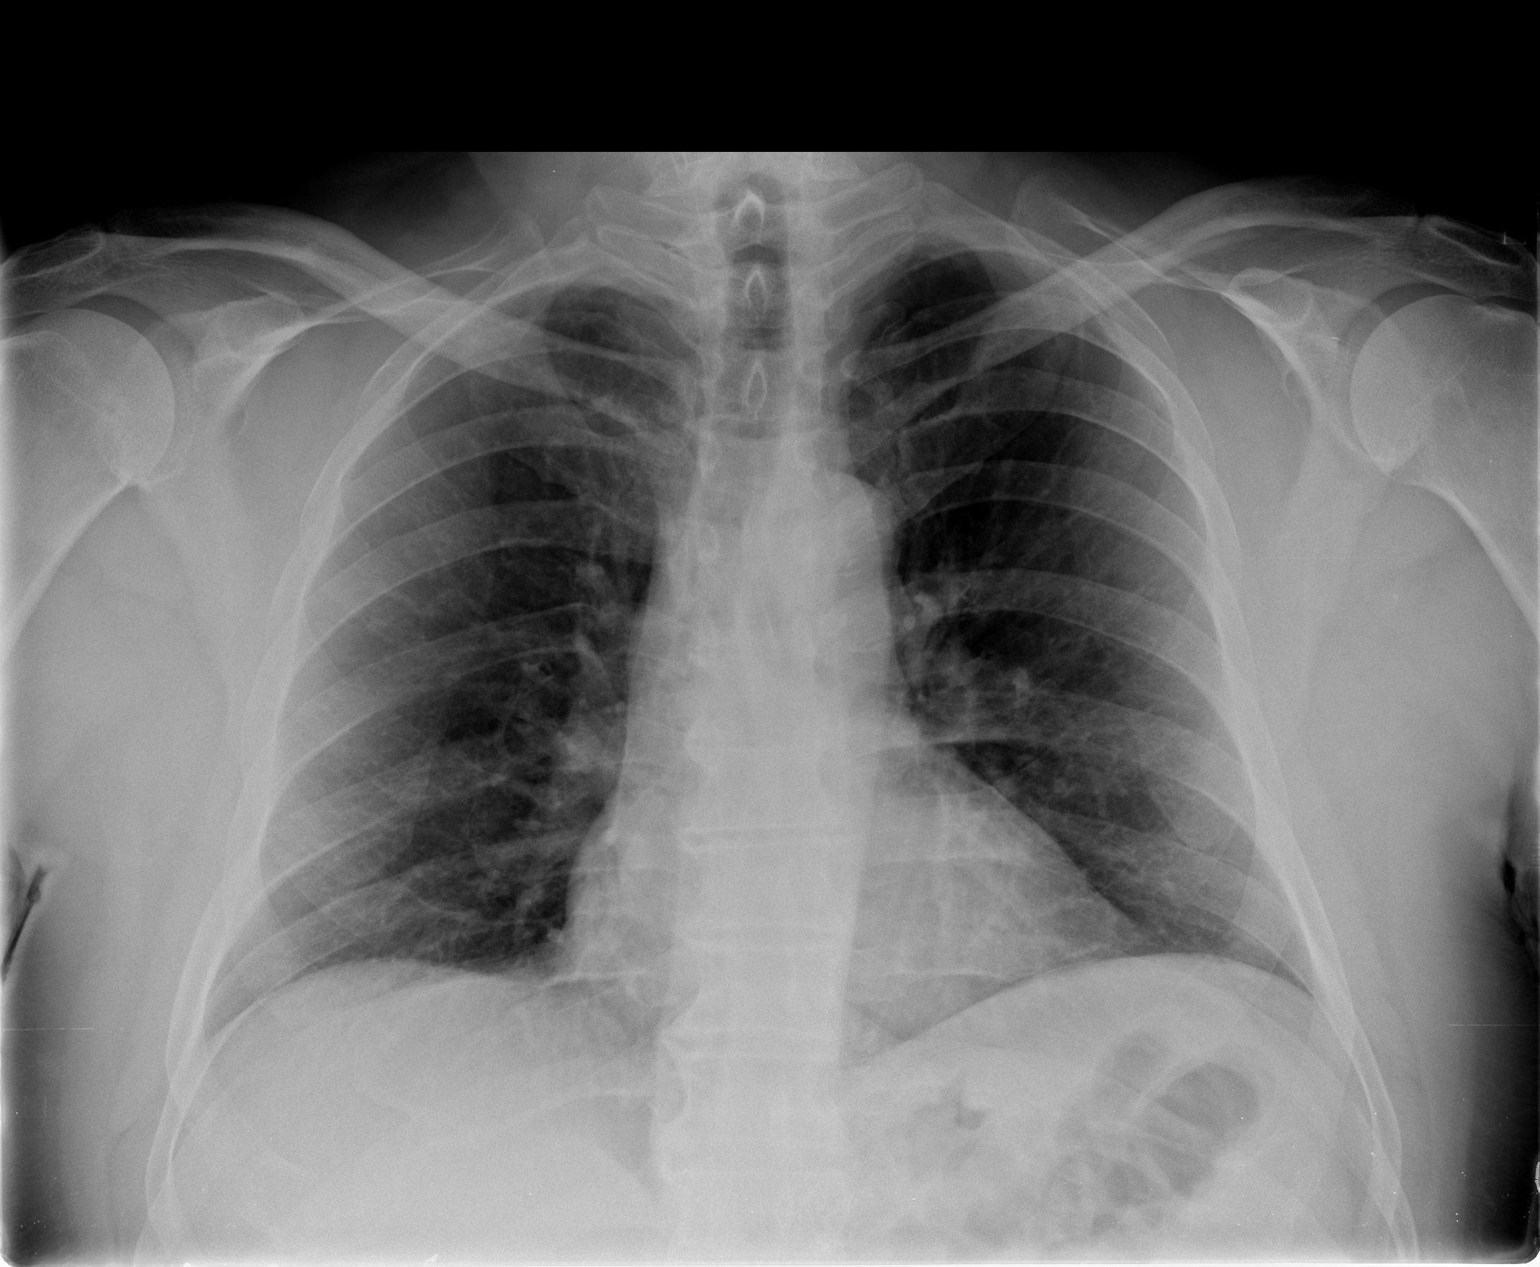

[view not recorded (2 of 2)]
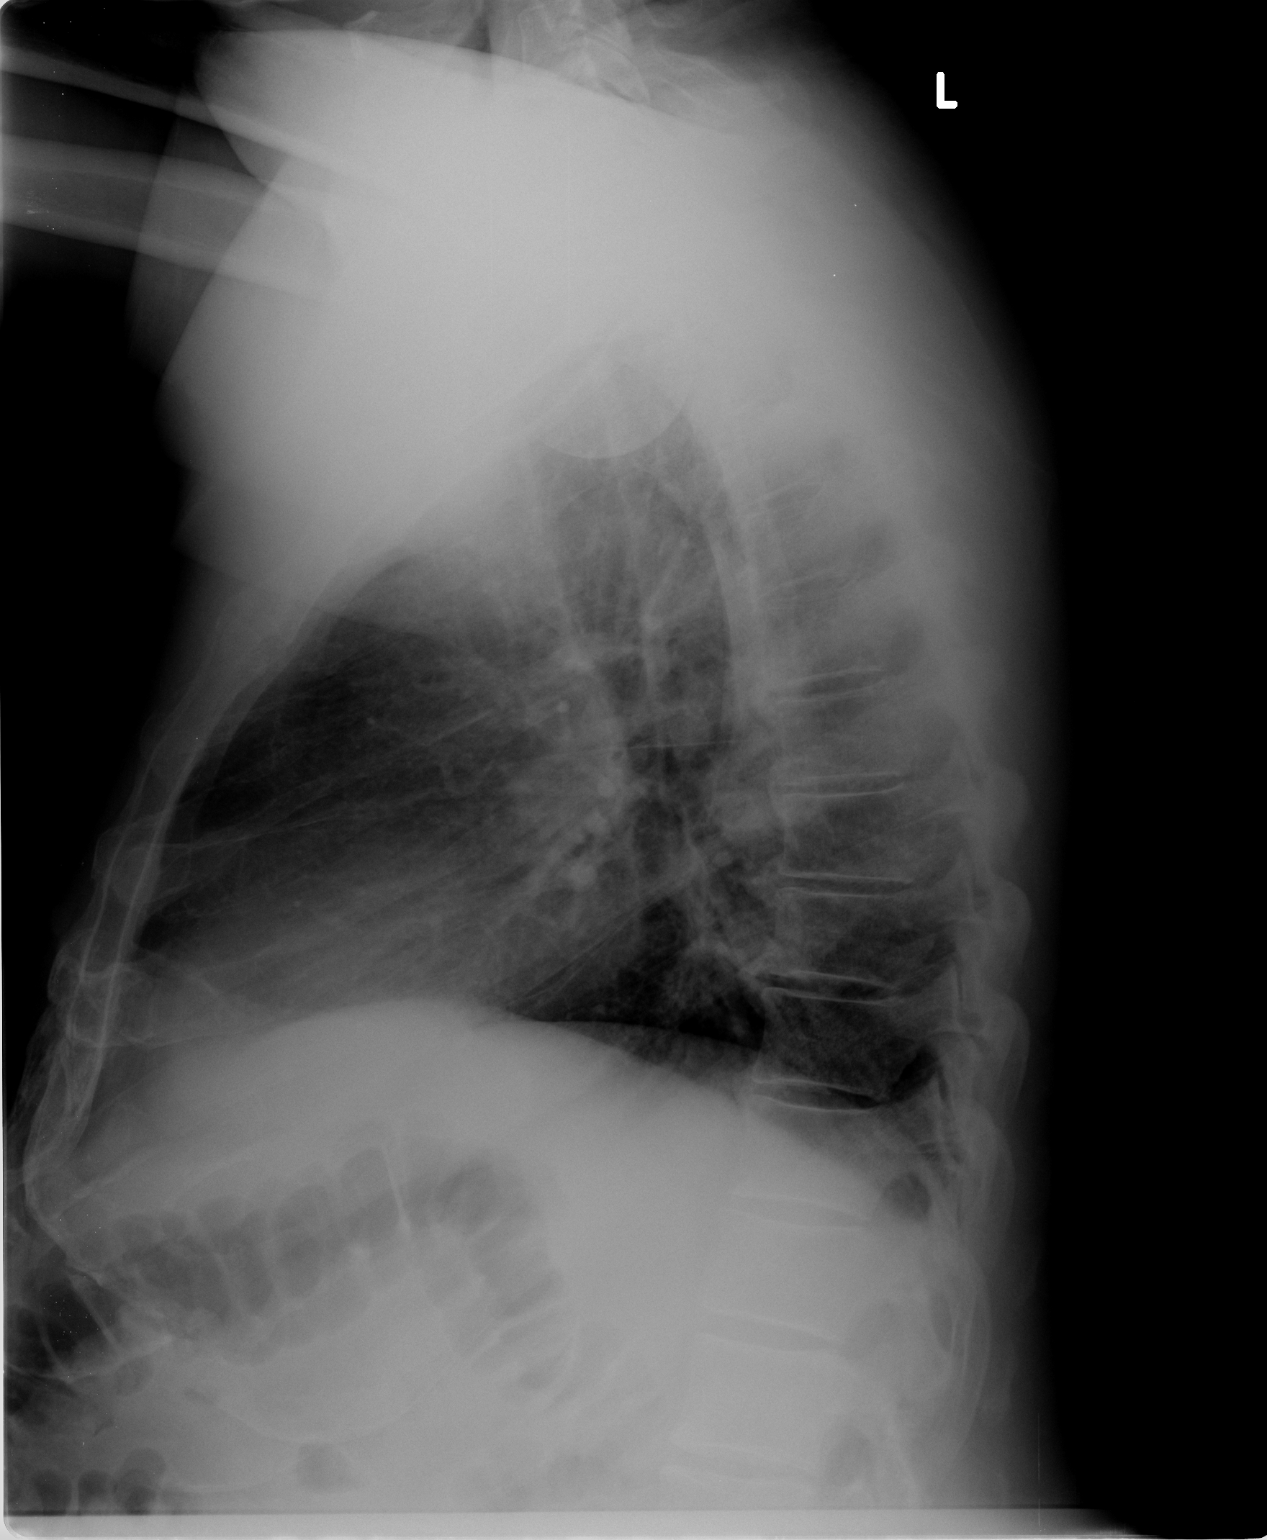

[2 of 2 positions shown; findings below may reference images not displayed]

FINDINGS: Heart is normal in size.  Bronchitic changes have a
chronic appearance.  Clear lungs.  Low volumes.  No pneumothorax or
pleural fluid.  No pulmonary nodule or mass.
IMPRESSION: No active cardiopulmonary disease.

## 2013-03-16 ENCOUNTER — Encounter: Payer: Self-pay | Admitting: Interventional Cardiology

## 2013-03-20 ENCOUNTER — Encounter: Payer: Self-pay | Admitting: Cardiology

## 2013-03-20 ENCOUNTER — Ambulatory Visit (INDEPENDENT_AMBULATORY_CARE_PROVIDER_SITE_OTHER): Payer: Medicare HMO | Admitting: Cardiology

## 2013-03-20 ENCOUNTER — Encounter (INDEPENDENT_AMBULATORY_CARE_PROVIDER_SITE_OTHER): Payer: Self-pay

## 2013-03-20 VITALS — BP 150/82 | HR 58 | Ht 66.0 in | Wt 215.0 lb

## 2013-03-20 DIAGNOSIS — E119 Type 2 diabetes mellitus without complications: Secondary | ICD-10-CM

## 2013-03-20 DIAGNOSIS — I2510 Atherosclerotic heart disease of native coronary artery without angina pectoris: Secondary | ICD-10-CM | POA: Insufficient documentation

## 2013-03-20 DIAGNOSIS — E669 Obesity, unspecified: Secondary | ICD-10-CM

## 2013-03-20 DIAGNOSIS — E7800 Pure hypercholesterolemia, unspecified: Secondary | ICD-10-CM

## 2013-03-20 MED ORDER — SIMVASTATIN 20 MG PO TABS: 40.0000 mg | ORAL_TABLET | Freq: Every day | ORAL | Status: DC

## 2013-03-20 MED ORDER — LISINOPRIL 5 MG PO TABS: 10.0000 mg | ORAL_TABLET | Freq: Every day | ORAL | Status: DC

## 2013-03-20 NOTE — Progress Notes (Signed)
[place]. [address], [address], [place]  [address] Phone: [phone] Fax:  [phone]  Date:  03/20/2013   ID:  [name], DOB 1941/02/19, MRN [id number]  PCP:  No primary provider on file.   History of Present Illness: [name] is a 73 y.o. male with CAD status post bypass surgery 03/27/10 x 4. Overall doing well. His hemoglobin A1c previously 6.8. Hopefully this will improve with weight loss. He sees Dr. Rolla Flatten primary care doctor in [place]. Mild postoperative renal insufficiency which resolved. Creatinine as high as 1.5 but now back to baseline. He is feeling well. No anginal symptoms, no shortness of breath.  in regards to hyperlipidemia, on simvastatin 20 mg. New start. Goal LDL is 70. At last check 105. I have personally reviewed labs he brought with him to clinic.    Wt Readings from Last 3 Encounters:  03/20/13 215 lb (97.523 kg)     Past Medical History  Diagnosis Date  . Shortness of breath   . Obesity, unspecified   . Dyslipidemia   . Diabetes     A1c in hospital 6.8  . Coronary atherosclerosis of native coronary artery   . CAD (coronary artery disease)     CAD-CABG x4(03/27/10 [facility]) LIMA to LAD, SVG to PDA and posterior lateral branches, SVG to first diagonal    Past Surgical History  Procedure Laterality Date  . Coronary artery bypass graft      CAD-CABG x4(03/27/10 [facility]) LIMA to LAD, SVG to PDA and posterior lateral branches, SVG to first diagonal  . Hernia repair      Current Outpatient Prescriptions  Medication Sig Dispense Refill  . aspirin 325 MG tablet Take 325 mg by mouth daily.      . Blood Glucose Monitoring Suppl (ONE TOUCH ULTRA MINI) W/DEVICE KIT       . lisinopril (PRINIVIL,ZESTRIL) 5 MG tablet Take 5 mg by mouth daily.      . metFORMIN (GLUCOPHAGE) 500 MG tablet       . metoprolol tartrate (LOPRESSOR) 25 MG tablet Take 12.5 mg by mouth 2 (two) times daily.      . ONE TOUCH ULTRA TEST test strip       .  ONETOUCH DELICA LANCETS FINE MISC       . simvastatin (ZOCOR) 20 MG tablet Take 20 mg by mouth at bedtime.       No current facility-administered medications for this visit.    Allergies:   No Known Allergies  Social History:  The patient  reports that he has never smoked. He does not have any smokeless tobacco history on file. He reports that he does not drink alcohol or use illicit drugs.   ROS:  Please see the history of present illness.   Denies any syncope, bleeding, orthopnea, PND    PHYSICAL EXAM: VS:  BP 150/82  Pulse 58  Ht _0  (1.676 m)  Wt 215 lb (97.523 kg)  BMI 34.72 kg/m2 Well nourished, well developed, in no acute distress HEENT: normal Neck: no JVD Cardiac:  normal S1, S2; RRR; no murmur Lungs:  clear to auscultation bilaterally, no wheezing, rhonchi or rales Abd: soft, nontender, no hepatomegalyObesity Ext: no edema Skin: warm and dry Neuro: no focal abnormalities noted  EKG:  Sinus bradycardia rate 58 with no other specific abnormalities   ASSESSMENT AND PLAN:  1. Coronary artery disease-status post bypass surgery. No active anginal symptoms. Currently well controlled with  aggressive secondary prevention. OK for ASA 81. 2. Hyperlipidemia-goal LDL 70. Optimally would like him to be on simvastatin 40 however he is currently on 20. Height of statin therapy can be of more benefit in the setting of coronary artery disease. Nonetheless, check goal LDL of 70. Increase to 68m. Check by PCP. 3. Obesity-continue to encourage weight loss. 4. HTN - increase lisinopril from 5 to 110m Goal <130/80. Continue close f/u with PCP.  5. Diabetes-continue to encourage control of diet, per primary physician.  Signed, MaCandee FurbishMD [facility]1/27/2015 9:01 AM

## 2013-03-20 NOTE — Patient Instructions (Signed)
Your physician has recommended you make the following change in your medication:   1. Increase Simvastatin to 40 mg once daily. 2. Increase Lisinopril to 10 mg once daily.  Your physician wants you to follow-up in: 1 year with Dr. Dawna Part will receive a reminder letter in the mail two months in advance. If you don't receive a letter, please call our office to schedule the follow-up appointment.

## 2013-03-20 NOTE — Addendum Note (Signed)
Addended by: Phineas Inches D on: 03/20/2013 09:36 AM   Modules accepted: Orders

## 2014-03-20 ENCOUNTER — Encounter: Payer: Self-pay | Admitting: Cardiology

## 2014-03-20 ENCOUNTER — Ambulatory Visit (INDEPENDENT_AMBULATORY_CARE_PROVIDER_SITE_OTHER): Payer: Medicare HMO | Admitting: Cardiology

## 2014-03-20 VITALS — BP 160/80 | HR 61 | Ht 66.0 in | Wt 206.0 lb

## 2014-03-20 DIAGNOSIS — E7800 Pure hypercholesterolemia, unspecified: Secondary | ICD-10-CM

## 2014-03-20 DIAGNOSIS — E669 Obesity, unspecified: Secondary | ICD-10-CM

## 2014-03-20 DIAGNOSIS — I2510 Atherosclerotic heart disease of native coronary artery without angina pectoris: Secondary | ICD-10-CM

## 2014-03-20 DIAGNOSIS — I10 Essential (primary) hypertension: Secondary | ICD-10-CM

## 2014-03-20 MED ORDER — ASPIRIN 81 MG PO TABS: 81.0000 mg | ORAL_TABLET | Freq: Every day | ORAL | Status: AC

## 2014-03-20 NOTE — Progress Notes (Signed)
[place]. [address] [place], [place]  [address] Phone: [phone] Fax:  [phone]  Date:  03/20/2014   ID:  [name], DOB [birth year], MRN [id number]  PCP:  Charlynn Court, NP   History of Present Illness: [name] is a 74 y.o. male with CAD status post bypass surgery 03/27/10 x 4. Overall doing well. His hemoglobin A1c previously 6.8. Hopefully this will continue to improve with weight loss. He sees [name] , NP primary care in [place]. Had mild postoperative renal insufficiency which resolved. Creatinine as high as 1.5 but now back to baseline 1.1. He is feeling well. No anginal symptoms, no shortness of breath.  HTN - 118/71 at home.   Treating hyperlipidemia as below. LDL much improved.     Wt Readings from Last 3 Encounters:  03/20/14 206 lb (93.441 kg)  03/20/13 215 lb (97.523 kg)     Past Medical History  Diagnosis Date  . Shortness of breath   . Obesity, unspecified   . Dyslipidemia   . Diabetes     A1c in hospital 6.8  . Coronary atherosclerosis of native coronary artery   . CAD (coronary artery disease)     CAD-CABG x4(03/27/10 [facility]) LIMA to LAD, SVG to PDA and posterior lateral branches, SVG to first diagonal    Past Surgical History  Procedure Laterality Date  . Coronary artery bypass graft      CAD-CABG x4(03/27/10 [facility]) LIMA to LAD, SVG to PDA and posterior lateral branches, SVG to first diagonal  . Hernia repair      Current Outpatient Prescriptions  Medication Sig Dispense Refill  . aspirin 325 MG tablet Take 325 mg by mouth daily.    . Blood Glucose Monitoring Suppl (ONE TOUCH ULTRA MINI) W/DEVICE KIT     . lisinopril (PRINIVIL,ZESTRIL) 5 MG tablet Take 2 tablets (10 mg total) by mouth daily. 60 tablet 4  . metFORMIN (GLUCOPHAGE) 1000 MG tablet Take 1,000 mg by mouth 2 (two) times daily with a meal.     . metoprolol tartrate (LOPRESSOR) 25 MG tablet Take 12.5 mg by mouth 2 (two) times daily.    . ONE TOUCH ULTRA  TEST test strip 1 each by Other route daily.     Glory Rosebush DELICA LANCETS FINE MISC 1 each daily.     . simvastatin (ZOCOR) 20 MG tablet Take 2 tablets (40 mg total) by mouth at bedtime. 60 tablet 4   No current facility-administered medications for this visit.    Allergies:   No Known Allergies  Social History:  The patient  reports that he has never smoked. He does not have any smokeless tobacco history on file. He reports that he does not drink alcohol or use illicit drugs.   ROS:  Please see the history of present illness.   Denies any syncope, bleeding, orthopnea, PND    PHYSICAL EXAM: VS:  BP 160/80 mmHg  Pulse 61  Ht _0  (1.676 m)  Wt 206 lb (93.441 kg)  BMI 33.27 kg/m2 Well nourished, well developed, in no acute distress HEENT: normal Neck: no JVD Cardiac:  normal S1, S2; RRR; no murmurScar well healed Lungs:  clear to auscultation bilaterally, no wheezing, rhonchi or rales Abd: soft, nontender, no hepatomegalyObesity Ext: no edema Skin: warm and dry Neuro: no focal abnormalities noted  EKG:  03/20/14-normal rhythm, sinus arrhythmia, 61, no other abnormalities-previous showed Sinus bradycardia rate 58 with no other specific abnormalities  [id number] 55%, questionable inferior hypokinesis, mild mitral valve regurgitation, grade 2 diastolic dysfunction.  Labs: Creat 1.1, ALT 41, LDL 79, A1c 6.4  ASSESSMENT AND PLAN:  1. Coronary artery disease-status post bypass surgery in 2012. No active anginal symptoms. Currently well controlled with aggressive secondary prevention. OK with ASA 81. 2. Hyperlipidemia-goal LDL 70. On simvastatin 40  Check by PCP. Labs as above. 3. Obesity-continue to encourage weight loss. Good job. 4. HTN - increase lisinopril from 5 to 67m. Goal <130/80. Continue close f/u with PCP.  5. Diabetes-continue to encourage control of diet, per primary physician.  Signed, MCandee Furbish MD [facility] 03/20/2014 9:46 AM

## 2014-03-20 NOTE — Patient Instructions (Addendum)
Your physician recommends that you continue on your current medications as directed. Please refer to the Current Medication list given to you today.  Your physician wants you to follow-up in: 1 year with Dr. Skains. You will receive a reminder letter in the mail two months in advance. If you don't receive a letter, please call our office to schedule the follow-up appointment.  

## 2014-05-16 DIAGNOSIS — Z6833 Body mass index (BMI) 33.0-33.9, adult: Secondary | ICD-10-CM | POA: Diagnosis not present

## 2014-05-16 DIAGNOSIS — E785 Hyperlipidemia, unspecified: Secondary | ICD-10-CM | POA: Diagnosis not present

## 2014-05-16 DIAGNOSIS — E119 Type 2 diabetes mellitus without complications: Secondary | ICD-10-CM | POA: Diagnosis not present

## 2014-05-16 DIAGNOSIS — I10 Essential (primary) hypertension: Secondary | ICD-10-CM | POA: Diagnosis not present

## 2014-05-16 DIAGNOSIS — I2510 Atherosclerotic heart disease of native coronary artery without angina pectoris: Secondary | ICD-10-CM | POA: Diagnosis not present

## 2014-05-21 DIAGNOSIS — Z1211 Encounter for screening for malignant neoplasm of colon: Secondary | ICD-10-CM | POA: Diagnosis not present

## 2015-03-21 DIAGNOSIS — E119 Type 2 diabetes mellitus without complications: Secondary | ICD-10-CM | POA: Diagnosis not present

## 2015-03-21 DIAGNOSIS — I10 Essential (primary) hypertension: Secondary | ICD-10-CM | POA: Diagnosis not present

## 2015-03-21 DIAGNOSIS — Z125 Encounter for screening for malignant neoplasm of prostate: Secondary | ICD-10-CM | POA: Diagnosis not present

## 2015-03-21 DIAGNOSIS — E785 Hyperlipidemia, unspecified: Secondary | ICD-10-CM | POA: Diagnosis not present

## 2015-03-21 DIAGNOSIS — Z6833 Body mass index (BMI) 33.0-33.9, adult: Secondary | ICD-10-CM | POA: Diagnosis not present

## 2015-07-03 DIAGNOSIS — E785 Hyperlipidemia, unspecified: Secondary | ICD-10-CM | POA: Diagnosis not present

## 2015-07-03 DIAGNOSIS — Z0000 Encounter for general adult medical examination without abnormal findings: Secondary | ICD-10-CM | POA: Diagnosis not present

## 2015-07-03 DIAGNOSIS — E119 Type 2 diabetes mellitus without complications: Secondary | ICD-10-CM | POA: Diagnosis not present

## 2015-07-03 DIAGNOSIS — I10 Essential (primary) hypertension: Secondary | ICD-10-CM | POA: Diagnosis not present

## 2015-07-04 ENCOUNTER — Encounter: Payer: Self-pay | Admitting: *Deleted

## 2015-07-08 ENCOUNTER — Encounter: Payer: Self-pay | Admitting: Cardiology

## 2015-07-08 NOTE — Progress Notes (Signed)
This encounter was created in error - please disregard.

## 2015-07-08 NOTE — Progress Notes (Deleted)
[place]. [address], [address] [place], [place]  [address] Phone: [phone] Fax:  [phone]  Date:  07/08/2015   ID:  [name], DOB Aug 11, 1940, MRN [id number]  PCP:  Charlynn Court, NP   History of Present Illness: [name] is a 75 y.o. male with CAD status post bypass surgery 03/27/10 x 4. Overall doing well. His hemoglobin A1c previously 6.8. Hopefully this will continue to improve with weight loss. He sees [name] , NP primary care in [place]. Had mild postoperative renal insufficiency which resolved. Creatinine as high as 1.5 but now back to baseline 1.1. He is feeling well. No anginal symptoms, no shortness of breath.  HTN - 118/71 at home.   Treating hyperlipidemia as below. LDL much improved.     Wt Readings from Last 3 Encounters:  03/20/14 206 lb (93.441 kg)  03/20/13 215 lb (97.523 kg)     Past Medical History  Diagnosis Date  . Shortness of breath   . Obesity, unspecified   . Dyslipidemia   . Diabetes ([place])     A1c in hospital 6.8  . Coronary atherosclerosis of native coronary artery   . CAD (coronary artery disease)     CAD-CABG x4(03/27/10 [facility]) LIMA to LAD, SVG to PDA and posterior lateral branches, SVG to first diagonal    Past Surgical History  Procedure Laterality Date  . Coronary artery bypass graft      CAD-CABG x4(03/27/10 [facility]) LIMA to LAD, SVG to PDA and posterior lateral branches, SVG to first diagonal  . Hernia repair      Current Outpatient Prescriptions  Medication Sig Dispense Refill  . aspirin 81 MG tablet Take 1 tablet (81 mg total) by mouth daily.    . Blood Glucose Monitoring Suppl (ONE TOUCH ULTRA MINI) W/DEVICE KIT     . lisinopril (PRINIVIL,ZESTRIL) 5 MG tablet Take 2 tablets (10 mg total) by mouth daily. 60 tablet 4  . metFORMIN (GLUCOPHAGE) 1000 MG tablet Take 1,000 mg by mouth 2 (two) times daily with a meal.     . metoprolol tartrate (LOPRESSOR) 25 MG tablet Take 12.5 mg by mouth 2 (two) times daily.      . ONE TOUCH ULTRA TEST test strip 1 each by Other route daily.     Glory Rosebush DELICA LANCETS FINE MISC 1 each daily.     . simvastatin (ZOCOR) 20 MG tablet Take 2 tablets (40 mg total) by mouth at bedtime. 60 tablet 4   No current facility-administered medications for this visit.    Allergies:   No Known Allergies  Social History:  The patient  reports that he has never smoked. He does not have any smokeless tobacco history on file. He reports that he does not drink alcohol or use illicit drugs.   ROS:  Please see the history of present illness.   Denies any syncope, bleeding, orthopnea, PND    PHYSICAL EXAM: VS:  There were no vitals taken for this visit. Well nourished, well developed, in no acute distress HEENT: normal Neck: no JVD Cardiac:  normal S1, S2; RRR; no murmurScar well healed Lungs:  clear to auscultation bilaterally, no wheezing, rhonchi or rales Abd: soft, nontender, no hepatomegalyObesity Ext: no edema Skin: warm and dry Neuro: no focal abnormalities noted  EKG:  03/20/14-normal rhythm, sinus arrhythmia, 61, no other abnormalities-previous showed Sinus bradycardia rate 58 with no other specific abnormalities  Echo: 2012-EF 55%, questionable inferior hypokinesis, mild mitral valve  regurgitation, grade 2 diastolic dysfunction.  Labs: Creat 1.1, ALT 41, LDL 79, A1c 6.4  ASSESSMENT AND PLAN:  1. Coronary artery disease-status post CABG in 2012. No active anginal symptoms. Currently well controlled with aggressive secondary prevention. OK with ASA 81. 2. Hyperlipidemia-goal LDL 70. On simvastatin 40  Check by PCP. Labs as above. 3. Obesity-continue to encourage weight loss. Good job. 4. HTN - increase lisinopril from 5 to 62m. Goal <130/80. Continue close f/u with PCP.  5. Diabetes-continue to encourage control of diet, per primary physician.  Signed, MCandee Furbish MD [facility] 07/08/2015 7:42 AM

## 2015-07-18 ENCOUNTER — Encounter: Payer: Self-pay | Admitting: Cardiology

## 2015-07-18 DIAGNOSIS — E119 Type 2 diabetes mellitus without complications: Secondary | ICD-10-CM | POA: Diagnosis not present

## 2015-08-04 ENCOUNTER — Ambulatory Visit (INDEPENDENT_AMBULATORY_CARE_PROVIDER_SITE_OTHER): Payer: Commercial Managed Care - HMO | Admitting: Cardiology

## 2015-08-04 ENCOUNTER — Encounter: Payer: Self-pay | Admitting: Cardiology

## 2015-08-04 VITALS — BP 140/80 | HR 62 | Ht 66.0 in | Wt 207.0 lb

## 2015-08-04 DIAGNOSIS — E7800 Pure hypercholesterolemia, unspecified: Secondary | ICD-10-CM

## 2015-08-04 DIAGNOSIS — I2510 Atherosclerotic heart disease of native coronary artery without angina pectoris: Secondary | ICD-10-CM | POA: Diagnosis not present

## 2015-08-04 DIAGNOSIS — R0602 Shortness of breath: Secondary | ICD-10-CM

## 2015-08-04 DIAGNOSIS — E669 Obesity, unspecified: Secondary | ICD-10-CM

## 2015-08-04 DIAGNOSIS — I10 Essential (primary) hypertension: Secondary | ICD-10-CM

## 2015-08-04 NOTE — Progress Notes (Signed)
[name]. [address] [place], [place]  [address] Phone: [phone] Fax:  [phone]  Date:  08/04/2015   ID:  [name], DOB 1940-04-22, MRN [id number]  PCP:  Charlynn Court, NP   History of Present Illness: [name] is a 75 y.o. male with CAD status post bypass surgery 03/27/10 x 4. Overall doing well. SOB prior to CABG. His hemoglobin A1c previously 6.8. Hopefully this will continue to improve with weight loss. He sees [name] , NP primary care in [place]. Had mild postoperative renal insufficiency which resolved. Creatinine as high as 1.5 but now back to baseline 1.1. He is now feeling some shortness of breath with moderate activity. HTN - 118/71 at home.   Treating hyperlipidemia as below. LDL much improved.     Wt Readings from Last 3 Encounters:  08/04/15 207 lb (93.895 kg)  03/20/14 206 lb (93.441 kg)  03/20/13 215 lb (97.523 kg)     Past Medical History  Diagnosis Date  . Shortness of breath   . Obesity, unspecified   . Dyslipidemia   . Diabetes ([place])     A1c in hospital 6.8  . Coronary atherosclerosis of native coronary artery   . CAD (coronary artery disease)     CAD-CABG x4(03/27/10 [facility]) LIMA to LAD, SVG to PDA and posterior lateral branches, SVG to first diagonal    Past Surgical History  Procedure Laterality Date  . Coronary artery bypass graft      CAD-CABG x4(03/27/10 [facility]) LIMA to LAD, SVG to PDA and posterior lateral branches, SVG to first diagonal  . Hernia repair      Current Outpatient Prescriptions  Medication Sig Dispense Refill  . aspirin 81 MG tablet Take 1 tablet (81 mg total) by mouth daily.    . Blood Glucose Monitoring Suppl (ONE TOUCH ULTRA MINI) W/DEVICE KIT     . lisinopril (PRINIVIL,ZESTRIL) 5 MG tablet Take 5 mg by mouth daily.    . metFORMIN (GLUCOPHAGE) 1000 MG tablet Take 1,000 mg by mouth 2 (two) times daily with a meal.     . metoprolol tartrate (LOPRESSOR) 25 MG tablet Take 12.5 mg by mouth 2  (two) times daily.    . ONE TOUCH ULTRA TEST test strip 1 each by Other route daily.     Glory Rosebush DELICA LANCETS FINE MISC 1 each daily.     . simvastatin (ZOCOR) 20 MG tablet Take 20 mg by mouth daily.     No current facility-administered medications for this visit.    Allergies:   No Known Allergies  Social History:  The patient  reports that he has never smoked. He does not have any smokeless tobacco history on file. He reports that he does not drink alcohol or use illicit drugs.   ROS:  Please see the history of present illness.   Denies any syncope, bleeding, orthopnea, PND    PHYSICAL EXAM: VS:  BP 140/80 mmHg  Pulse 62  Ht 5' 6"  (1.676 m)  Wt 207 lb (93.895 kg)  BMI 33.43 kg/m2 Well nourished, well developed, in no acute distress HEENT: normal Neck: no JVD Cardiac:  normal S1, S2; RRR; no murmurScar well healed Lungs:  clear to auscultation bilaterally, no wheezing, rhonchi or rales Abd: soft, nontender, no hepatomegalyObesity Ext: no edema Skin: warm and dry Neuro: no focal abnormalities noted  EKG:  EKG was ordered today 08/04/15 - sinus rhythm, PACs, otherwise unremarkable. 03/20/14-normal rhythm, sinus arrhythmia,  61, no other abnormalities-previous showed Sinus bradycardia rate 58 with no other specific abnormalities  Echo:2012-EF 55%, questionable inferior hypokinesis, mild mitral valve regurgitation, grade 2 diastolic dysfunction.  Labs: Creatinine 1.1, LDL 56, HDL 38, triglycerides 1:15, hemoglobin A1c 6.8 ASSESSMENT AND PLAN:  1. Coronary artery disease-status post bypass surgery in 2012. He is having dyspnea on exertion, post hole digging, moving trash out to [employer]. He remembers prior to bypass that he had similar symptoms. I will check a nuclear stress test, he should be able to walk. Hold metoprolol. Currently well controlled with aggressive secondary prevention. OK with ASA 81. 2. Hyperlipidemia-goal LDL 70. He is well. On simvastatin 40  Check by PCP. Labs  as above. 3. Obesity-continue to encourage weight loss. Good job. Continue to try to lose weight. 4. HTN -  Lisinopril 5  Goal <130/80. Continue close f/u with PCP.  5. Diabetes-continue to encourage control of diet, per primary physician. 6. One-year follow-up. We will provide results of stress test.  Signed, Candee Furbish, MD [facility]  08/04/2015 8:11 AM

## 2015-08-04 NOTE — Patient Instructions (Signed)
Medication Instructions:  The current medical regimen is effective;  continue present plan and medications.  Testing/Procedures: Your physician has requested that you have a myoview. For further information please visit [email or web address]. Please follow instruction sheet, as given.  Follow-Up: Follow up in 1 year with Dr. Skains.  You will receive a letter in the mail 2 months before you are due.  Please call us when you receive this letter to schedule your follow up appointment.  If you need a refill on your cardiac medications before your next appointment, please call your pharmacy.  Thank you for choosing [facility]!!     

## 2015-08-12 ENCOUNTER — Telehealth (HOSPITAL_COMMUNITY): Payer: Self-pay | Admitting: *Deleted

## 2015-08-12 NOTE — Telephone Encounter (Signed)
Attempted to call patient regarding upcoming appointment- no answer, and unable to leave message. Hubbard Robinson, RN

## 2015-08-15 ENCOUNTER — Ambulatory Visit (HOSPITAL_COMMUNITY): Payer: Commercial Managed Care - HMO | Attending: Cardiovascular Disease

## 2015-08-15 DIAGNOSIS — I10 Essential (primary) hypertension: Secondary | ICD-10-CM | POA: Diagnosis not present

## 2015-08-15 DIAGNOSIS — R0609 Other forms of dyspnea: Secondary | ICD-10-CM | POA: Diagnosis not present

## 2015-08-15 DIAGNOSIS — R0602 Shortness of breath: Secondary | ICD-10-CM

## 2015-08-15 LAB — MYOCARDIAL PERFUSION IMAGING
CHL CUP MPHR: 146 {beats}/min
CHL CUP NUCLEAR SSS: 4
CSEPHR: 93 %
CSEPPHR: 136 {beats}/min
Estimated workload: 5.4 METS
Exercise duration (min): 4 min
Exercise duration (sec): 0 s
LHR: 0.36
LV dias vol: 119 mL (ref 62–150)
LVSYSVOL: 53 mL
NUC STRESS TID: 0.97
Rest HR: 56 {beats}/min
SDS: 4
SRS: 0

## 2015-08-15 MED ORDER — TECHNETIUM TC 99M TETROFOSMIN IV KIT
31.6000 | PACK | Freq: Once | INTRAVENOUS | Status: AC | PRN
  Administered 2015-08-15: 32 via INTRAVENOUS
  Filled 2015-08-15: qty 32

## 2015-08-15 MED ORDER — TECHNETIUM TC 99M TETROFOSMIN IV KIT
10.6000 | PACK | Freq: Once | INTRAVENOUS | Status: AC | PRN
  Administered 2015-08-15: 11 via INTRAVENOUS
  Filled 2015-08-15: qty 11

## 2015-08-28 ENCOUNTER — Encounter: Payer: Self-pay | Admitting: *Deleted

## 2015-10-31 DIAGNOSIS — Z6833 Body mass index (BMI) 33.0-33.9, adult: Secondary | ICD-10-CM | POA: Diagnosis not present

## 2015-10-31 DIAGNOSIS — E119 Type 2 diabetes mellitus without complications: Secondary | ICD-10-CM | POA: Diagnosis not present

## 2015-10-31 DIAGNOSIS — I10 Essential (primary) hypertension: Secondary | ICD-10-CM | POA: Diagnosis not present

## 2015-10-31 DIAGNOSIS — Z23 Encounter for immunization: Secondary | ICD-10-CM | POA: Diagnosis not present

## 2015-10-31 DIAGNOSIS — I2510 Atherosclerotic heart disease of native coronary artery without angina pectoris: Secondary | ICD-10-CM | POA: Diagnosis not present

## 2015-10-31 DIAGNOSIS — E785 Hyperlipidemia, unspecified: Secondary | ICD-10-CM | POA: Diagnosis not present

## 2016-03-03 DIAGNOSIS — I10 Essential (primary) hypertension: Secondary | ICD-10-CM | POA: Diagnosis not present

## 2016-03-03 DIAGNOSIS — E119 Type 2 diabetes mellitus without complications: Secondary | ICD-10-CM | POA: Diagnosis not present

## 2016-03-03 DIAGNOSIS — E785 Hyperlipidemia, unspecified: Secondary | ICD-10-CM | POA: Diagnosis not present

## 2016-03-03 DIAGNOSIS — Z125 Encounter for screening for malignant neoplasm of prostate: Secondary | ICD-10-CM | POA: Diagnosis not present

## 2016-03-12 DIAGNOSIS — E785 Hyperlipidemia, unspecified: Secondary | ICD-10-CM | POA: Diagnosis not present

## 2016-03-12 DIAGNOSIS — Z6834 Body mass index (BMI) 34.0-34.9, adult: Secondary | ICD-10-CM | POA: Diagnosis not present

## 2016-03-12 DIAGNOSIS — I2510 Atherosclerotic heart disease of native coronary artery without angina pectoris: Secondary | ICD-10-CM | POA: Diagnosis not present

## 2016-03-12 DIAGNOSIS — E119 Type 2 diabetes mellitus without complications: Secondary | ICD-10-CM | POA: Diagnosis not present

## 2016-03-12 DIAGNOSIS — I10 Essential (primary) hypertension: Secondary | ICD-10-CM | POA: Diagnosis not present

## 2016-03-15 DIAGNOSIS — I10 Essential (primary) hypertension: Secondary | ICD-10-CM | POA: Diagnosis not present

## 2016-03-15 DIAGNOSIS — Z6833 Body mass index (BMI) 33.0-33.9, adult: Secondary | ICD-10-CM | POA: Diagnosis not present

## 2016-03-30 DIAGNOSIS — I10 Essential (primary) hypertension: Secondary | ICD-10-CM | POA: Diagnosis not present

## 2016-03-30 DIAGNOSIS — Z6841 Body Mass Index (BMI) 40.0 and over, adult: Secondary | ICD-10-CM | POA: Diagnosis not present

## 2016-05-14 DIAGNOSIS — L570 Actinic keratosis: Secondary | ICD-10-CM | POA: Diagnosis not present

## 2016-05-14 DIAGNOSIS — L821 Other seborrheic keratosis: Secondary | ICD-10-CM | POA: Diagnosis not present

## 2016-05-14 DIAGNOSIS — L578 Other skin changes due to chronic exposure to nonionizing radiation: Secondary | ICD-10-CM | POA: Diagnosis not present

## 2016-05-14 DIAGNOSIS — I8310 Varicose veins of unspecified lower extremity with inflammation: Secondary | ICD-10-CM | POA: Diagnosis not present

## 2016-06-14 DIAGNOSIS — L570 Actinic keratosis: Secondary | ICD-10-CM | POA: Diagnosis not present

## 2016-07-27 ENCOUNTER — Encounter: Payer: Self-pay | Admitting: Cardiology

## 2016-08-16 ENCOUNTER — Ambulatory Visit: Payer: Self-pay | Admitting: Cardiology

## 2016-08-23 ENCOUNTER — Ambulatory Visit (INDEPENDENT_AMBULATORY_CARE_PROVIDER_SITE_OTHER): Payer: Medicare HMO | Admitting: Nurse Practitioner

## 2016-08-23 ENCOUNTER — Encounter: Payer: Self-pay | Admitting: Nurse Practitioner

## 2016-08-23 VITALS — BP 166/84 | HR 62 | Ht 66.0 in | Wt 208.1 lb

## 2016-08-23 DIAGNOSIS — E7800 Pure hypercholesterolemia, unspecified: Secondary | ICD-10-CM

## 2016-08-23 DIAGNOSIS — I2510 Atherosclerotic heart disease of native coronary artery without angina pectoris: Secondary | ICD-10-CM | POA: Diagnosis not present

## 2016-08-23 DIAGNOSIS — I10 Essential (primary) hypertension: Secondary | ICD-10-CM

## 2016-08-23 LAB — BASIC METABOLIC PANEL
BUN/Creatinine Ratio: 14 (ref 10–24)
BUN: 16 mg/dL (ref 8–27)
CO2: 20 mmol/L (ref 20–29)
Calcium: 9.2 mg/dL (ref 8.6–10.2)
Chloride: 103 mmol/L (ref 96–106)
Creatinine, Ser: 1.15 mg/dL (ref 0.76–1.27)
GFR calc Af Amer: 72 mL/min/{1.73_m2} (ref >59)
GFR calc non Af Amer: 62 mL/min/{1.73_m2} (ref >59)
Glucose: 125 mg/dL — ABNORMAL HIGH (ref 65–99)
Potassium: 4.5 mmol/L (ref 3.5–5.2)
Sodium: 140 mmol/L (ref 134–144)

## 2016-08-23 MED ORDER — LISINOPRIL 20 MG PO TABS: 20.0000 mg | ORAL_TABLET | Freq: Every day | ORAL | 3 refills | Status: DC

## 2016-08-23 NOTE — Patient Instructions (Addendum)
We will be checking the following labs today - BMET  We will check lab when you come back - you do not need to fast   Medication Instructions:    Continue with your current medicines. BUT  I am increasing the Lisinopril to 20 mg a day - this has been sent to your drug store. You can take 2 of the 10 mg tablets to use up.     Testing/Procedures To Be Arranged:  N/A  Follow-Up:   See me in one month with your BP cuff  See Dr. Marlou Porch in one year    Other Special Instructions:   Here are my tips to lose weight:  1. Drink only water. You do not need milk, juice, tea, soda or diet soda.  2. Do not eat anything "white". This includes white bread, grits, potatoes, rice or mayo  3. Stay away from fried foods and sweets  4. Your portion should be the size of the palm of your hand.  5. Know what your weaknesses are and avoid.   6. Find an exercise you like and do it every day for 45 to 60 minutes.         If you need a refill on your cardiac medications before your next appointment, please call your pharmacy.   Call the [place] office at [phone] if you have any questions, problems or concerns.

## 2016-08-23 NOTE — Progress Notes (Signed)
CARDIOLOGY OFFICE NOTE  Date:  08/23/2016    [name] Date of Birth: Jan 19, 1941 Medical Record #[id number]  PCP:  Charlynn Court, NP  Cardiologist:  [facility]   Chief Complaint  Patient presents with  . Coronary Artery Disease    Follow up visit - seen for Dr. Marlou Porch    History of Present Illness: [name] is a 76 y.o. male who presents today for a follow up visit. Seen for Dr. Marlou Porch.   He has known CAD status post bypass surgery 03/27/10 x 4. Other issues include HLD, DM, obesity and HTN.   Last seen a year ago and was doing ok.   Comes in today. Here alone. Complains of back pain, elbow pain. No chest pain. Says his breathing is ok. BP is up here today. Says he has had some medicine changes for his BP - looks like his ACE was increased. Not really checking his BP over the last month. He has had his medicines today. No real exercise but tries to stay active. Admits he likes to eat - portions are too big. He likes soda and milk as well. Loves grits. His A1C is 6.5 from January of 2018. Lipids stable. LDL is 63. BUN and creatinine ok. He wants to work on his weight.   Past Medical History:  Diagnosis Date  . CAD (coronary artery disease)    CAD-CABG x4(03/27/10 [place]) LIMA to LAD, SVG to PDA and posterior lateral branches, SVG to first diagonal  . Coronary atherosclerosis of native coronary artery   . Diabetes ([place])    A1c in hospital 6.8  . Dyslipidemia   . Obesity, unspecified   . Shortness of breath     Past Surgical History:  Procedure Laterality Date  . CORONARY ARTERY BYPASS GRAFT     CAD-CABG x4(03/27/10 [facility]) LIMA to LAD, SVG to PDA and posterior lateral branches, SVG to first diagonal  . HERNIA REPAIR       Medications: Current Meds  Medication Sig  . aspirin 81 MG tablet Take 1 tablet (81 mg total) by mouth daily.  . Blood Glucose Monitoring Suppl (ONE TOUCH ULTRA MINI) W/DEVICE KIT   . metFORMIN (GLUCOPHAGE) 1000 MG tablet Take 1,000 mg by  mouth 2 (two) times daily with a meal.   . metoprolol tartrate (LOPRESSOR) 25 MG tablet Take 12.5 mg by mouth 2 (two) times daily.  . ONE TOUCH ULTRA TEST test strip 1 each by Other route daily.   Glory Rosebush DELICA LANCETS FINE MISC 1 each daily.   . simvastatin (ZOCOR) 20 MG tablet Take 20 mg by mouth daily.  . [DISCONTINUED] lisinopril (PRINIVIL,ZESTRIL) 10 MG tablet Take 10 mg by mouth daily.   . [DISCONTINUED] lisinopril (PRINIVIL,ZESTRIL) 5 MG tablet Take 5 mg by mouth daily.     Allergies: No Known Allergies  Social History: The patient  reports that he has never smoked. He has never used smokeless tobacco. He reports that he does not drink alcohol or use drugs.   Family History: The patient's family history includes COPD in his father; Cancer - Lung in his father.   Review of Systems: Please see the history of present illness.   Otherwise, the review of systems is positive for none.   All other systems are reviewed and negative.   Physical Exam: VS:  BP (!) 166/84 (BP Location: Left Arm, Patient Position: Sitting, Cuff Size: Normal)   Pulse 62   Ht 5' 6"  (1.676 m)  Wt 208 lb 1.9 oz (94.4 kg)   BMI 33.59 kg/m  .  BMI Body mass index is 33.59 kg/m.  Wt Readings from Last 3 Encounters:  08/23/16 208 lb 1.9 oz (94.4 kg)  08/04/15 207 lb (93.9 kg)  03/20/14 206 lb (93.4 kg)   BP recheck by me is 170/80  General: Pleasant. Well developed, well nourished and in no acute distress. Remains obese.   HEENT: Normal.  Neck: Supple, no JVD, carotid bruits, or masses noted.  Cardiac: Regular rate and rhythm. No murmurs, rubs, or gallops. No edema.  Respiratory:  Lungs are clear to auscultation bilaterally with normal work of breathing.  GI: Soft and nontender.  MS: No deformity or atrophy. Gait and ROM intact.  Skin: Warm and dry. Color is normal.  Neuro:  Strength and sensation are intact and no gross focal deficits noted.  Psych: Alert, appropriate and with normal  affect.   LABORATORY DATA:  EKG:  EKG is ordered today. This demonstrates NSR with nonspecific T wave changes.  Lab Results  Component Value Date   WBC 5.2 03/30/2010   HGB 8.7 (L) 03/30/2010   HCT 26.7 (L) 03/30/2010   PLT 106 (L) 03/30/2010   GLUCOSE 113 (H) 03/30/2010   ALT 54 (H) 03/27/2010   AST 31 03/27/2010   NA 136 03/30/2010   K 4.6 03/30/2010   CL 103 03/30/2010   CREATININE 1.09 03/30/2010   BUN 22 03/30/2010   CO2 27 03/30/2010   INR 1.31 03/27/2010   HGBA1C (H) 03/27/2010    6.8 (NOTE)                                                                       According to the ADA Clinical Practice Recommendations for 2011, when HbA1c is used as a screening test:   >=6.5%   Diagnostic of Diabetes Mellitus           (if abnormal result  is confirmed)  5.7-6.4%   Increased risk of developing Diabetes Mellitus  References:Diagnosis and Classification of Diabetes Mellitus,Diabetes HBZJ,6967,89(FYBOF 1):S62-S69 and Standards of Medical Care in         Diabetes - 2011,Diabetes Care,2011,34  (Suppl 1):S11-S61.     BNP (last 3 results) No results for input(s): BNP in the last 8760 hours.  ProBNP (last 3 results) No results for input(s): PROBNP in the last 8760 hours.   Other Studies Reviewed Today:  Myoview Study Highlights 07/2015    Nuclear stress EF: 55%. Paradoxical septal motion.  Downsloping ST segment depression ST segment depression of 1 mm was noted during stress in the II, III, V4, V5 and V6 leads, beginning at 2 minutes of stress, ending at 1 minutes of stress, and returning to baseline after less than 1 minute of recovery.  Blood pressure demonstrated a hypertensive response to exercise with peak BP 215/83 mmHg.  The study is normal.  This is a low risk study.  The left ventricular ejection fraction is normal (55-65%).   This is a normal exercise nuclear stress test with no evidence for prior infarct or ischemia.      Assessment/Plan:  1. CAD  with prior CABG in 2012 - low risk Myoview from 2017 - he is doing  well clinically. Needs better BP control. Labs are checked by PCP.   2. HTN - increasing the ACE today. Discussed CV risk factor modification in depth. See back in a month.   3. HLD -on statin therapy - labs from PCP noted.   4. DM - recent A1C noted from january  5. Obesity - discussed at length. My tips given.     Current medicines are reviewed with the patient today.  The patient does not have concerns regarding medicines other than what has been noted above.  The following changes have been made:  See above.  Labs/ tests ordered today include:    Orders Placed This Encounter  Procedures  . Basic metabolic panel  . EKG 12-Lead     Disposition:   FU with Dr. Marlou Porch in one year. I will see back in a month to recheck BP and redo BMET   Patient is agreeable to this plan and will call if any problems develop in the interim.   SignedTruitt Merle, NP  08/23/2016 9:04 AM  [place] [address] [place],   [address] Phone: [phone] Fax: [phone]

## 2016-09-21 DIAGNOSIS — I10 Essential (primary) hypertension: Secondary | ICD-10-CM | POA: Diagnosis not present

## 2016-09-21 DIAGNOSIS — E162 Hypoglycemia, unspecified: Secondary | ICD-10-CM | POA: Diagnosis not present

## 2016-09-21 DIAGNOSIS — E119 Type 2 diabetes mellitus without complications: Secondary | ICD-10-CM | POA: Diagnosis not present

## 2016-09-21 DIAGNOSIS — I2510 Atherosclerotic heart disease of native coronary artery without angina pectoris: Secondary | ICD-10-CM | POA: Diagnosis not present

## 2016-09-21 DIAGNOSIS — E785 Hyperlipidemia, unspecified: Secondary | ICD-10-CM | POA: Diagnosis not present

## 2016-09-22 ENCOUNTER — Ambulatory Visit: Payer: Self-pay | Admitting: Nurse Practitioner

## 2016-09-22 NOTE — Progress Notes (Deleted)
CARDIOLOGY OFFICE NOTE  Date:  09/22/2016    [name] Date of Birth: 1940-04-08 Medical Record #[id number]  PCP:  Charlynn Court, NP  Cardiologist:  Marisa Cyphers  No chief complaint on file.   History of Present Illness: [name] is a 76 y.o. male who presents today for a 1 month check. Seen for Dr. Marlou Porch.   He has known CAD status post bypass surgery 03/27/10 x 4. Other issues include HLD, DM, obesity and HTN.   Last seen by Dr. Marlou Porch in June of 2017 - was doing ok.   I saw him last month - BP was up. Weight was up. ACE was increased. Labs are checked by PCP.   Comes in today. Here alone.   Past Medical History:  Diagnosis Date  . CAD (coronary artery disease)    CAD-CABG x4(03/27/10 [facility]) LIMA to LAD, SVG to PDA and posterior lateral branches, SVG to first diagonal  . Coronary atherosclerosis of native coronary artery   . Diabetes ([place])    A1c in hospital 6.8  . Dyslipidemia   . Obesity, unspecified   . Shortness of breath     Past Surgical History:  Procedure Laterality Date  . CORONARY ARTERY BYPASS GRAFT     CAD-CABG x4(03/27/10 [facility]) LIMA to LAD, SVG to PDA and posterior lateral branches, SVG to first diagonal  . HERNIA REPAIR       Medications: No outpatient prescriptions have been marked as taking for the 09/22/16 encounter (Appointment) with Burtis Junes, NP.     Allergies: No Known Allergies  Social History: The patient  reports that he has never smoked. He has never used smokeless tobacco. He reports that he does not drink alcohol or use drugs.   Family History: The patient's family history includes COPD in his father; Cancer - Lung in his father.   Review of Systems: Please see the history of present illness.   Otherwise, the review of systems is positive for none.   All other systems are reviewed and negative.   Physical Exam: VS:  There were no vitals taken for this visit. Marland Kitchen  BMI There is no height or weight  on file to calculate BMI.  Wt Readings from Last 3 Encounters:  08/23/16 208 lb 1.9 oz (94.4 kg)  08/04/15 207 lb (93.9 kg)  03/20/14 206 lb (93.4 kg)    General: Pleasant. Well developed, well nourished and in no acute distress.   HEENT: Normal.  Neck: Supple, no JVD, carotid bruits, or masses noted.  Cardiac: Regular rate and rhythm. No murmurs, rubs, or gallops. No edema.  Respiratory:  Lungs are clear to auscultation bilaterally with normal work of breathing.  GI: Soft and nontender.  MS: No deformity or atrophy. Gait and ROM intact.  Skin: Warm and dry. Color is normal.  Neuro:  Strength and sensation are intact and no gross focal deficits noted.  Psych: Alert, appropriate and with normal affect.   LABORATORY DATA:  EKG:  EKG is not ordered today.  Lab Results  Component Value Date   WBC 5.2 03/30/2010   HGB 8.7 (L) 03/30/2010   HCT 26.7 (L) 03/30/2010   PLT 106 (L) 03/30/2010   GLUCOSE 125 (H) 08/23/2016   ALT 54 (H) 03/27/2010   AST 31 03/27/2010   NA 140 08/23/2016   K 4.5 08/23/2016   CL 103 08/23/2016   CREATININE 1.15 08/23/2016   BUN 16 08/23/2016   CO2 20  08/23/2016   INR 1.31 03/27/2010   HGBA1C (H) 03/27/2010    6.8 (NOTE)                                                                       According to the ADA Clinical Practice Recommendations for 2011, when HbA1c is used as a screening test:   >=6.5%   Diagnostic of Diabetes Mellitus           (if abnormal result  is confirmed)  5.7-6.4%   Increased risk of developing Diabetes Mellitus  References:Diagnosis and Classification of Diabetes Mellitus,Diabetes JMEQ,6834,19(QQIWL 1):S62-S69 and Standards of Medical Care in         Diabetes - 2011,Diabetes Care,2011,34  (Suppl 1):S11-S61.     BNP (last 3 results) No results for input(s): BNP in the last 8760 hours.  ProBNP (last 3 results) No results for input(s): PROBNP in the last 8760 hours.   Other Studies Reviewed Today:  Myoview Study  Highlights 07/2015    Nuclear stress EF: 55%. Paradoxical septal motion.  Downsloping ST segment depression ST segment depression of 1 mm was noted during stress in the II, III, V4, V5 and V6 leads, beginning at 2 minutes of stress, ending at 1 minutes of stress, and returning to baseline after less than 1 minute of recovery.  Blood pressure demonstrated a hypertensive response to exercise with peak BP 215/83 mmHg.  The study is normal.  This is a low risk study.  The left ventricular ejection fraction is normal (55-65%).  This is a normal exercise nuclear stress test with no evidence for prior infarct or ischemia.      Assessment/Plan:  1. CAD with prior CABG in 2012 - low risk Myoview from 2017 - he is doing well clinically. Needs better BP control. Labs are checked by PCP.   2. HTN - increasing the ACE today. Discussed CV risk factor modification in depth. See back in a month.   3. HLD -on statin therapy - labs from PCP noted.   4. DM - recent A1C noted from january  5. Obesity - discussed at length. My tips given.     Current medicines are reviewed with the patient today.  The patient does not have concerns regarding medicines other than what has been noted above.  The following changes have been made:  See above.  Labs/ tests ordered today include:   No orders of the defined types were placed in this encounter.    Disposition:   FU with *** in {gen number [id number]} {Days to years:10300}.   Patient is agreeable to this plan and will call if any problems develop in the interim.   SignedTruitt Merle, NP  09/22/2016 7:48 AM  [place] [address] [place], [place]  [address] Phone: [phone] Fax: [phone]

## 2016-09-29 ENCOUNTER — Other Ambulatory Visit: Payer: Self-pay | Admitting: Nurse Practitioner

## 2016-09-29 ENCOUNTER — Ambulatory Visit (INDEPENDENT_AMBULATORY_CARE_PROVIDER_SITE_OTHER): Payer: Medicare HMO | Admitting: Nurse Practitioner

## 2016-09-29 VITALS — BP 160/72 | HR 70 | Ht 66.0 in | Wt 200.0 lb

## 2016-09-29 DIAGNOSIS — Z79899 Other long term (current) drug therapy: Secondary | ICD-10-CM | POA: Diagnosis not present

## 2016-09-29 DIAGNOSIS — I2510 Atherosclerotic heart disease of native coronary artery without angina pectoris: Secondary | ICD-10-CM

## 2016-09-29 DIAGNOSIS — I10 Essential (primary) hypertension: Secondary | ICD-10-CM

## 2016-09-29 LAB — BASIC METABOLIC PANEL
BUN/Creatinine Ratio: 24 (ref 10–24)
BUN: 30 mg/dL — ABNORMAL HIGH (ref 8–27)
CO2: 22 mmol/L (ref 20–29)
Calcium: 9.9 mg/dL (ref 8.6–10.2)
Chloride: 106 mmol/L (ref 96–106)
Creatinine, Ser: 1.25 mg/dL (ref 0.76–1.27)
GFR calc Af Amer: 64 mL/min/{1.73_m2} (ref >59)
GFR calc non Af Amer: 56 mL/min/{1.73_m2} — ABNORMAL LOW (ref >59)
Glucose: 94 mg/dL (ref 65–99)
Potassium: 5.6 mmol/L — ABNORMAL HIGH (ref 3.5–5.2)
Sodium: 140 mmol/L (ref 134–144)

## 2016-09-29 NOTE — Progress Notes (Signed)
CARDIOLOGY OFFICE NOTE  Date:  09/29/2016    [name] Date of Birth: 12/08/1940 Medical Record #[id number]  PCP:  Charlynn Court, NP  Cardiologist:  Marisa Cyphers  Chief Complaint  Patient presents with  . Hypertension    History of Present Illness: [name] is a 76 y.o. male who presents today for a one month check. Seen for Dr. Marlou Porch.   He has known CAD status post bypass surgery 03/27/10 x 4. Other issues include HLD, DM, obesity and HTN.   Seen last June by Dr. Marlou Porch and was doing ok. I saw him about 5 weeks ago - was doing ok but BP was up and I increased his ACE - needed to work on CV risk factor modification.   Comes in today. Seen during timeframe with no EPIC access. Here with his daughter and grand daughter today. He is doing well. Has really changed his diet. Has lost 8 pounds already. BP has improved by his home readings and his cuff matches up ok. He has no chest pain. Breathing is ok. Tolerating his medicines. No real concerns today.   Past Medical History:  Diagnosis Date  . CAD (coronary artery disease)    CAD-CABG x4(03/27/10 [place]) LIMA to LAD, SVG to PDA and posterior lateral branches, SVG to first diagonal  . Coronary atherosclerosis of native coronary artery   . Diabetes ([place])    A1c in hospital 6.8  . Dyslipidemia   . Obesity, unspecified   . Shortness of breath     Past Surgical History:  Procedure Laterality Date  . CORONARY ARTERY BYPASS GRAFT     CAD-CABG x4(03/27/10 [place]) LIMA to LAD, SVG to PDA and posterior lateral branches, SVG to first diagonal  . HERNIA REPAIR       Medications: Current Meds  Medication Sig  . aspirin 81 MG tablet Take 1 tablet (81 mg total) by mouth daily.  . Blood Glucose Monitoring Suppl (ONE TOUCH ULTRA MINI) W/DEVICE KIT   . lisinopril (PRINIVIL,ZESTRIL) 20 MG tablet Take 1 tablet (20 mg total) by mouth daily.  . metFORMIN (GLUCOPHAGE) 1000 MG tablet Take 1,000 mg by mouth 2 (two) times  daily with a meal.   . metoprolol tartrate (LOPRESSOR) 25 MG tablet Take 12.5 mg by mouth 2 (two) times daily.  . ONE TOUCH ULTRA TEST test strip 1 each by Other route daily.   Glory Rosebush DELICA LANCETS FINE MISC 1 each daily.   . simvastatin (ZOCOR) 20 MG tablet Take 20 mg by mouth daily.     Allergies: No Known Allergies  Social History: The patient  reports that he has never smoked. He has never used smokeless tobacco. He reports that he does not drink alcohol or use drugs.   Family History: The patient's family history includes COPD in his father; Cancer - Lung in his father.   Review of Systems: Please see the history of present illness.   Otherwise, the review of systems is positive for none.   All other systems are reviewed and negative.   Physical Exam: VS:  BP (!) 160/72   Pulse 70   Ht 5' 6"  (1.676 m)   Wt 200 lb (90.7 kg)   SpO2 97%   BMI 32.28 kg/m  .  BMI Body mass index is 32.28 kg/m.  Wt Readings from Last 3 Encounters:  09/29/16 200 lb (90.7 kg)  08/23/16 208 lb 1.9 oz (94.4 kg)  08/04/15 207 lb (93.9  kg)   BP my me is 140/60  General: Pleasant. Well developed, well nourished and in no acute distress.  His weight is down 8 pounds.  HEENT: Normal.  Neck: Supple, no JVD, carotid bruits, or masses noted.  Cardiac: Regular rate and rhythm. No murmurs, rubs, or gallops. No edema.  Respiratory:  Lungs are clear to auscultation bilaterally with normal work of breathing.  GI: Soft and nontender.  MS: No deformity or atrophy. Gait and ROM intact.  Skin: Warm and dry. Color is normal.  Neuro:  Strength and sensation are intact and no gross focal deficits noted.  Psych: Alert, appropriate and with normal affect.   LABORATORY DATA:  EKG:  EKG is not ordered today.  Lab Results  Component Value Date   WBC 5.2 03/30/2010   HGB 8.7 (L) 03/30/2010   HCT 26.7 (L) 03/30/2010   PLT 106 (L) 03/30/2010   GLUCOSE 125 (H) 08/23/2016   ALT 54 (H) 03/27/2010   AST  31 03/27/2010   NA 140 08/23/2016   K 4.5 08/23/2016   CL 103 08/23/2016   CREATININE 1.15 08/23/2016   BUN 16 08/23/2016   CO2 20 08/23/2016   INR 1.31 03/27/2010   HGBA1C (H) 03/27/2010    6.8 (NOTE)                                                                       According to the ADA Clinical Practice Recommendations for 2011, when HbA1c is used as a screening test:   >=6.5%   Diagnostic of Diabetes Mellitus           (if abnormal result  is confirmed)  5.7-6.4%   Increased risk of developing Diabetes Mellitus  References:Diagnosis and Classification of Diabetes Mellitus,Diabetes IRCV,8938,10(FBPZW 1):S62-S69 and Standards of Medical Care in         Diabetes - 2011,Diabetes Care,2011,34  (Suppl 1):S11-S61.     BNP (last 3 results) No results for input(s): BNP in the last 8760 hours.  ProBNP (last 3 results) No results for input(s): PROBNP in the last 8760 hours.   Other Studies Reviewed Today  Myoview Study Highlights 07/2015    Nuclear stress EF: 55%. Paradoxical septal motion.  Downsloping ST segment depression ST segment depression of 1 mm was noted during stress in the II, III, V4, V5 and V6 leads, beginning at 2 minutes of stress, ending at 1 minutes of stress, and returning to baseline after less than 1 minute of recovery.  Blood pressure demonstrated a hypertensive response to exercise with peak BP 215/83 mmHg.  The study is normal.  This is a low risk study.  The left ventricular ejection fraction is normal (55-65%).  This is a normal exercise nuclear stress test with no evidence for prior infarct or ischemia.      Assessment/Plan:  1. CAD with prior CABG in 2012 - low risk Myoview from 2017 - he is doing well clinically.  Labs are checked by PCP.   2. HTN - increased his ACE at last visit - he has better control based on home readings. He seems motivated to make life style changes and is actively losing weight.   3. HLD -on statin therapy -  labs from PCP noted.  4. DM - recent A1C noted from January  5. Obesity - actively losing weight.   Current medicines are reviewed with the patient today.  The patient does not have concerns regarding medicines other than what has been noted above.  The following changes have been made:  See above.  Labs/ tests ordered today include:   No orders of the defined types were placed in this encounter.    Disposition:   FU with me in 3 to 4 months.   Patient is agreeable to this plan and will call if any problems develop in the interim.   SignedTruitt Merle, NP  09/29/2016 7:29 PM  [facility] [address] [place], [place]  [address] Phone: [phone] Fax: [phone]

## 2016-09-29 NOTE — Patient Instructions (Signed)
We will be checking the following labs today - BMET   Medication Instructions:    Continue with your current medicines.     Testing/Procedures To Be Arranged:  N/A  Follow-Up:   See me in 3 months    Other Special Instructions:   Keep working on your weight  Keep monitoring your BP for me    If you need a refill on your cardiac medications before your next appointment, please call your pharmacy.   Call the [place] office at [phone] if you have any questions, problems or concerns.

## 2016-10-01 ENCOUNTER — Telehealth: Payer: Self-pay | Admitting: *Deleted

## 2016-10-01 NOTE — Telephone Encounter (Signed)
Pt gave wife's number at ov due to pt not hearing, answering phone, or no vm set up.  Was to call today to give pt a 3 month f/u.  Called wife's number no vm set up. Will try Monday.

## 2016-10-04 NOTE — Telephone Encounter (Signed)
S/w pt's wife per pt's request, states pt does not answer phone.  Made appt for 3 month f/u.

## 2016-10-05 ENCOUNTER — Other Ambulatory Visit: Payer: Self-pay | Admitting: *Deleted

## 2016-10-05 DIAGNOSIS — R799 Abnormal finding of blood chemistry, unspecified: Secondary | ICD-10-CM

## 2016-10-07 DIAGNOSIS — C44329 Squamous cell carcinoma of skin of other parts of face: Secondary | ICD-10-CM | POA: Diagnosis not present

## 2016-10-08 ENCOUNTER — Encounter (INDEPENDENT_AMBULATORY_CARE_PROVIDER_SITE_OTHER): Payer: Self-pay

## 2016-10-08 ENCOUNTER — Other Ambulatory Visit: Payer: Medicare HMO | Admitting: *Deleted

## 2016-10-08 DIAGNOSIS — R799 Abnormal finding of blood chemistry, unspecified: Secondary | ICD-10-CM | POA: Diagnosis not present

## 2016-10-08 LAB — BASIC METABOLIC PANEL
BUN/Creatinine Ratio: 19 (ref 10–24)
BUN: 23 mg/dL (ref 8–27)
CO2: 20 mmol/L (ref 20–29)
Calcium: 9.2 mg/dL (ref 8.6–10.2)
Chloride: 107 mmol/L — ABNORMAL HIGH (ref 96–106)
Creatinine, Ser: 1.18 mg/dL (ref 0.76–1.27)
GFR calc Af Amer: 69 mL/min/{1.73_m2} (ref >59)
GFR calc non Af Amer: 60 mL/min/{1.73_m2} (ref >59)
Glucose: 105 mg/dL — ABNORMAL HIGH (ref 65–99)
Potassium: 5.2 mmol/L (ref 3.5–5.2)
Sodium: 139 mmol/L (ref 134–144)

## 2016-10-14 DIAGNOSIS — C44329 Squamous cell carcinoma of skin of other parts of face: Secondary | ICD-10-CM | POA: Diagnosis not present

## 2016-12-28 ENCOUNTER — Ambulatory Visit: Payer: Self-pay | Admitting: Nurse Practitioner

## 2017-05-02 DIAGNOSIS — J209 Acute bronchitis, unspecified: Secondary | ICD-10-CM | POA: Diagnosis not present

## 2017-07-07 DIAGNOSIS — I25119 Atherosclerotic heart disease of native coronary artery with unspecified angina pectoris: Secondary | ICD-10-CM | POA: Diagnosis not present

## 2017-07-07 DIAGNOSIS — E7800 Pure hypercholesterolemia, unspecified: Secondary | ICD-10-CM | POA: Diagnosis not present

## 2017-07-07 DIAGNOSIS — I10 Essential (primary) hypertension: Secondary | ICD-10-CM | POA: Diagnosis not present

## 2017-07-07 DIAGNOSIS — Z6833 Body mass index (BMI) 33.0-33.9, adult: Secondary | ICD-10-CM | POA: Diagnosis not present

## 2017-07-07 DIAGNOSIS — Z23 Encounter for immunization: Secondary | ICD-10-CM | POA: Diagnosis not present

## 2017-07-07 DIAGNOSIS — E119 Type 2 diabetes mellitus without complications: Secondary | ICD-10-CM | POA: Diagnosis not present

## 2017-07-07 DIAGNOSIS — Z0000 Encounter for general adult medical examination without abnormal findings: Secondary | ICD-10-CM | POA: Diagnosis not present

## 2017-07-28 ENCOUNTER — Other Ambulatory Visit: Payer: Self-pay | Admitting: Nurse Practitioner

## 2017-08-23 DIAGNOSIS — H1033 Unspecified acute conjunctivitis, bilateral: Secondary | ICD-10-CM | POA: Diagnosis not present

## 2017-09-06 ENCOUNTER — Ambulatory Visit: Payer: Medicare HMO | Admitting: Cardiology

## 2017-09-06 ENCOUNTER — Encounter: Payer: Self-pay | Admitting: Cardiology

## 2017-09-06 VITALS — BP 140/68 | HR 58 | Ht 66.0 in | Wt 205.0 lb

## 2017-09-06 DIAGNOSIS — E7800 Pure hypercholesterolemia, unspecified: Secondary | ICD-10-CM

## 2017-09-06 DIAGNOSIS — I10 Essential (primary) hypertension: Secondary | ICD-10-CM | POA: Diagnosis not present

## 2017-09-06 DIAGNOSIS — I2510 Atherosclerotic heart disease of native coronary artery without angina pectoris: Secondary | ICD-10-CM | POA: Diagnosis not present

## 2017-09-06 NOTE — Patient Instructions (Signed)

## 2017-09-06 NOTE — Progress Notes (Signed)
[facility]. [address], [address], [place]  [address] Phone: [phone] Fax:  [phone]  Date:  09/06/2017   ID:  KAYDN KUMPF, DOB [birth year], MRN [id number]  PCP:  Algis Greenhouse, MD   History of Present Illness: [name] is a 77 y.o. male with CAD status post bypass surgery 03/27/10 x 4. Overall doing well. SOB prior to CABG. His hemoglobin A1c previously 6.8. Hopefully this will continue to improve with weight loss. He sees [name] , NP primary care in [place]. Had mild postoperative renal insufficiency which resolved. Creatinine as high as 1.5 but now back to baseline 1.1. HTN - 118/71 at home.   Treating hyperlipidemia as below. LDL much improved.   09/06/2017-overall has been doing quite well.  Stress test a year ago was low risk with no ischemia.  This was done after he was feeling some dyspnea with exertion, postal digging for instance.  He has not been experiencing any of this recently.  He still struggles with weight gain.  We discussed.  No chest pain fevers chills nausea vomiting syncope bleeding.  Taking his medications.  Metformin has been decreased by his primary physician.   Wt Readings from Last 3 Encounters:  09/06/17 205 lb (93 kg)  09/29/16 200 lb (90.7 kg)  08/23/16 208 lb 1.9 oz (94.4 kg)     Past Medical History:  Diagnosis Date  . CAD (coronary artery disease)    CAD-CABG x4(03/27/10 [facility]) LIMA to LAD, SVG to PDA and posterior lateral branches, SVG to first diagonal  . Coronary atherosclerosis of native coronary artery   . Diabetes ([place])    A1c in hospital 6.8  . Dyslipidemia   . Obesity, unspecified   . Shortness of breath     Past Surgical History:  Procedure Laterality Date  . CORONARY ARTERY BYPASS GRAFT     CAD-CABG x4(03/27/10 [facility]) LIMA to LAD, SVG to PDA and posterior lateral branches, SVG to first diagonal  . HERNIA REPAIR      Current Outpatient Medications  Medication Sig Dispense Refill  . aspirin 81 MG  tablet Take 1 tablet (81 mg total) by mouth daily.    . Blood Glucose Monitoring Suppl (ONE TOUCH ULTRA MINI) W/DEVICE KIT     . CETIRIZINE HCL PO Apply 1 application topically as needed for itching.    Marland Kitchen lisinopril (PRINIVIL,ZESTRIL) 20 MG tablet Take 1 tablet (20 mg total) by mouth daily. 90 tablet 0  . metFORMIN (GLUCOPHAGE) 1000 MG tablet Take 1,000 mg by mouth 2 (two) times daily with a meal.     . metoprolol tartrate (LOPRESSOR) 25 MG tablet Take 12.5 mg by mouth 2 (two) times daily.    . ONE TOUCH ULTRA TEST test strip 1 each by Other route daily.     Glory Rosebush DELICA LANCETS FINE MISC 1 each daily.     . simvastatin (ZOCOR) 20 MG tablet Take 20 mg by mouth daily.     No current facility-administered medications for this visit.     Allergies:   No Known Allergies  Social History:  The patient  reports that he has never smoked. He has never used smokeless tobacco. He reports that he does not drink alcohol or use drugs.   ROS:  Please see the history of present illness.   All other review of systems negative PHYSICAL EXAM: VS:  BP 140/68   Pulse (!) 58   Ht _0  (1.676 m)  Wt 205 lb (93 kg)   SpO2 96%   BMI 33.09 kg/m  GEN: Well nourished, well developed, in no acute distress, overweight HEENT: normal  Neck: no JVD, carotid bruits, or masses Cardiac: RRR; no murmurs, rubs, or gallops,no edema  Respiratory:  clear to auscultation bilaterally, normal work of breathing GI: soft, nontender, nondistended, + BS MS: no deformity or atrophy  Skin: warm and dry, no rash Neuro:  Alert and Oriented x 3, Strength and sensation are intact Psych: euthymic mood, full affect   EKG:  EKG was ordered today 08/04/15 - sinus rhythm, PACs, otherwise unremarkable. 03/20/14-normal rhythm, sinus arrhythmia, 61, no other abnormalities-previous showed Sinus bradycardia rate 58 with no other specific abnormalities  Echo:2012-EF 55%, questionable inferior hypokinesis, mild mitral valve  regurgitation, grade 2 diastolic dysfunction.  Nuclear stress test 08/15/2015: Normal, no ischemia  Labs: LDL 83, triglycerides 129, hemoglobin A1c 6.2   ASSESSMENT AND PLAN:  1. Coronary artery disease-status post bypass surgery in 2012.  Nuclear stress test in 2017 was low risk, no ischemia.  Excellent.  This was done after he was having some shortness of breath with activity such as post hole digging.  Overall he is not complaining of any of the symptoms currently.  Continue with aggressive secondary prevention.  Aspirin, statin, diabetes control. 2. Hyperlipidemia-goal LDL 70. He is well. On simvastatin 20  Checked by PCP. Labs as above.  Overall doing very well, no myalgias. 3. Obesity-continue to encourage weight loss.  We discussed again at length.  Decreasing carbohydrates.  Vegetables.   4. HTN -  Lisinopril 5  Goal <130/80. Continue close f/u with PCP.  At home he has been doing quite well.   5. Diabetes-continue to encourage control of diet, per primary physician.  Metformin has been reduced to 500 by his primary physician. 6. One-year follow-up.   Signed, Candee Furbish, MD [facility]  09/06/2017 9:19 AM

## 2017-09-20 DIAGNOSIS — R944 Abnormal results of kidney function studies: Secondary | ICD-10-CM | POA: Diagnosis not present

## 2017-09-28 ENCOUNTER — Other Ambulatory Visit: Payer: Self-pay | Admitting: Nurse Practitioner

## 2017-11-03 DIAGNOSIS — R944 Abnormal results of kidney function studies: Secondary | ICD-10-CM | POA: Diagnosis not present

## 2017-11-03 DIAGNOSIS — I10 Essential (primary) hypertension: Secondary | ICD-10-CM | POA: Diagnosis not present

## 2018-02-27 IMAGING — NM NM MISC PROCEDURE
3 series · 18 of 18 positions shown · non-contrast
Comparison: none

[Series 1: rest_(id)_sa · 6.4mm · 6.40mm/px · 6 of 64 frames shown]
[frame 6/64]
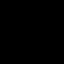
[frame 16/64]
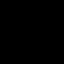
[frame 27/64]
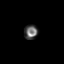
[frame 38/64]
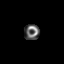
[frame 48/64]
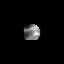
[frame 59/64]
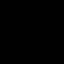

[Series 1: stress-gsp_(id)_sa · 6.4mm · 6.40mm/px · 6 of 512 frames shown]
[frame 43/512]
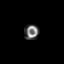
[frame 128/512]
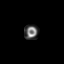
[frame 214/512]
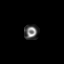
[frame 299/512]
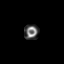
[frame 384/512]
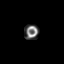
[frame 470/512]
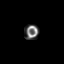

[Series 1: stress-sum-em_(id)_sa · 6.4mm · 6.40mm/px · 6 of 64 frames shown]
[frame 6/64]
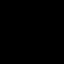
[frame 16/64]
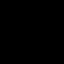
[frame 27/64]
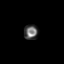
[frame 38/64]
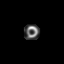
[frame 48/64]
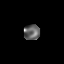
[frame 59/64]
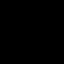

[18 of 18 positions shown; findings below may reference images not displayed]

Canned report from images found in remote index.

Refer to host system for actual result text.

## 2018-02-28 DIAGNOSIS — N183 Chronic kidney disease, stage 3 (moderate): Secondary | ICD-10-CM | POA: Diagnosis not present

## 2018-02-28 DIAGNOSIS — I129 Hypertensive chronic kidney disease with stage 1 through stage 4 chronic kidney disease, or unspecified chronic kidney disease: Secondary | ICD-10-CM | POA: Diagnosis not present

## 2018-02-28 DIAGNOSIS — E7800 Pure hypercholesterolemia, unspecified: Secondary | ICD-10-CM | POA: Diagnosis not present

## 2018-02-28 DIAGNOSIS — E1122 Type 2 diabetes mellitus with diabetic chronic kidney disease: Secondary | ICD-10-CM | POA: Diagnosis not present

## 2018-02-28 DIAGNOSIS — E119 Type 2 diabetes mellitus without complications: Secondary | ICD-10-CM | POA: Diagnosis not present

## 2018-02-28 DIAGNOSIS — Z6834 Body mass index (BMI) 34.0-34.9, adult: Secondary | ICD-10-CM | POA: Diagnosis not present

## 2018-02-28 DIAGNOSIS — I25119 Atherosclerotic heart disease of native coronary artery with unspecified angina pectoris: Secondary | ICD-10-CM | POA: Diagnosis not present

## 2018-02-28 DIAGNOSIS — E6609 Other obesity due to excess calories: Secondary | ICD-10-CM | POA: Diagnosis not present

## 2018-03-22 DIAGNOSIS — D485 Neoplasm of uncertain behavior of skin: Secondary | ICD-10-CM | POA: Diagnosis not present

## 2018-03-22 DIAGNOSIS — C44329 Squamous cell carcinoma of skin of other parts of face: Secondary | ICD-10-CM | POA: Diagnosis not present

## 2018-03-22 DIAGNOSIS — L570 Actinic keratosis: Secondary | ICD-10-CM | POA: Diagnosis not present

## 2018-05-06 ENCOUNTER — Other Ambulatory Visit: Payer: Self-pay | Admitting: Nurse Practitioner

## 2018-05-10 DIAGNOSIS — L570 Actinic keratosis: Secondary | ICD-10-CM | POA: Diagnosis not present

## 2018-05-10 DIAGNOSIS — L578 Other skin changes due to chronic exposure to nonionizing radiation: Secondary | ICD-10-CM | POA: Diagnosis not present

## 2018-05-10 DIAGNOSIS — L820 Inflamed seborrheic keratosis: Secondary | ICD-10-CM | POA: Diagnosis not present

## 2018-05-10 DIAGNOSIS — L821 Other seborrheic keratosis: Secondary | ICD-10-CM | POA: Diagnosis not present

## 2018-11-22 ENCOUNTER — Telehealth: Payer: Self-pay | Admitting: Cardiology

## 2018-11-22 ENCOUNTER — Encounter: Payer: Self-pay | Admitting: Cardiology

## 2018-11-22 NOTE — Telephone Encounter (Signed)
Noted.  Comments put in appt notes.

## 2018-11-22 NOTE — Telephone Encounter (Signed)
Pt has difficulty hearing and needs his wife in order to communicate at his appointment. Spoke pts wife and told her it would be okay if she accompanied him to his OV tomorrow.

## 2018-11-22 NOTE — Telephone Encounter (Signed)
New Message:  Wife of patient wanted to know if she would be able to come with her husband to his appointment tomorrow. He has trouble hearing and would need someone else to be there to hear what the doctor has to say  Please advise

## 2018-11-23 ENCOUNTER — Encounter: Payer: Self-pay | Admitting: Cardiology

## 2018-11-23 ENCOUNTER — Encounter (INDEPENDENT_AMBULATORY_CARE_PROVIDER_SITE_OTHER): Payer: Self-pay

## 2018-11-23 ENCOUNTER — Other Ambulatory Visit: Payer: Self-pay

## 2018-11-23 ENCOUNTER — Ambulatory Visit: Payer: Medicare HMO | Admitting: Cardiology

## 2018-11-23 VITALS — BP 130/68 | HR 57 | Ht 66.0 in | Wt 208.0 lb

## 2018-11-23 DIAGNOSIS — Z79899 Other long term (current) drug therapy: Secondary | ICD-10-CM | POA: Diagnosis not present

## 2018-11-23 DIAGNOSIS — E7800 Pure hypercholesterolemia, unspecified: Secondary | ICD-10-CM | POA: Diagnosis not present

## 2018-11-23 DIAGNOSIS — I10 Essential (primary) hypertension: Secondary | ICD-10-CM

## 2018-11-23 MED ORDER — ATORVASTATIN CALCIUM 40 MG PO TABS: 40.0000 mg | ORAL_TABLET | Freq: Every day | ORAL | 3 refills | Status: DC

## 2018-11-23 NOTE — Progress Notes (Signed)
[facility]. [address], [address] [place], [place]  [address] Phone: [phone] Fax:  [phone]  Date:  11/23/2018   ID:  [name], DOB 03/07/1940, MRN [id number]  PCP:  Algis Greenhouse, MD   History of Present Illness: [name] is a 78 y.o. male with CAD status post bypass surgery 03/27/10 x 4. Overall doing well. SOB prior to CABG. His hemoglobin A1c previously 6.8. Hopefully this will continue to improve with weight loss. He sees [name] , NP primary care in [place]. Had mild postoperative renal insufficiency which resolved. Creatinine as high as 1.5 but now back to baseline 1.1. HTN - 118/71 at home.   Treating hyperlipidemia as below. LDL much improved.   09/06/2017-overall has been doing quite well.  Stress test a year ago was low risk with no ischemia.  This was done after he was feeling some dyspnea with exertion, postal digging for instance.  He has not been experiencing any of this recently.  He still struggles with weight gain.  We discussed.  No chest pain fevers chills nausea vomiting syncope bleeding.  Taking his medications.  Metformin has been decreased by his primary physician.  11/23/2018-here for the follow-up of coronary artery disease.  Overall he is doing well without any anginal symptoms.  No fevers chills nausea vomiting syncope bleeding.  He did tell me a story where he climbed up in a tree.  Told him that he probably should avoid this.  See below for further details.  Wt Readings from Last 3 Encounters:  11/23/18 208 lb (94.3 kg)  09/06/17 205 lb (93 kg)  09/29/16 200 lb (90.7 kg)     Past Medical History:  Diagnosis Date  . CAD (coronary artery disease)    CAD-CABG x4(03/27/10 [place]) LIMA to LAD, SVG to PDA and posterior lateral branches, SVG to first diagonal  . Coronary atherosclerosis of native coronary artery   . Diabetes ([place])    A1c in hospital 6.8  . Dyslipidemia   . Obesity, unspecified   . Shortness of breath     Past  Surgical History:  Procedure Laterality Date  . CORONARY ARTERY BYPASS GRAFT     CAD-CABG x4(03/27/10 [place]) LIMA to LAD, SVG to PDA and posterior lateral branches, SVG to first diagonal  . HERNIA REPAIR      Current Outpatient Medications  Medication Sig Dispense Refill  . aspirin 81 MG tablet Take 1 tablet (81 mg total) by mouth daily.    . Blood Glucose Monitoring Suppl (ONE TOUCH ULTRA MINI) W/DEVICE KIT     . CETIRIZINE HCL PO Apply 1 application topically as needed for itching.    Marland Kitchen lisinopril (PRINIVIL,ZESTRIL) 20 MG tablet TAKE 1 TABLET EVERY DAY 90 tablet 3  . metFORMIN (GLUCOPHAGE) 1000 MG tablet Take 1,000 mg by mouth 2 (two) times daily with a meal.     . metoprolol tartrate (LOPRESSOR) 25 MG tablet Take 12.5 mg by mouth 2 (two) times daily.    . ONE TOUCH ULTRA TEST test strip 1 each by Other route daily.     Glory Rosebush DELICA LANCETS FINE MISC 1 each daily.     Marland Kitchen atorvastatin (LIPITOR) 40 MG tablet Take 1 tablet (40 mg total) by mouth daily. 90 tablet 3   No current facility-administered medications for this visit.     Allergies:   No Known Allergies  Social History:  The patient  reports that he has never smoked. He  has never used smokeless tobacco. He reports that he does not drink alcohol or use drugs.   ROS:  Please see the history of present illness.   All other review of systems negative  PHYSICAL EXAM: VS:  BP 130/68   Pulse (!) 57   Ht _0  (1.676 m)   Wt 208 lb (94.3 kg)   SpO2 98%   BMI 33.57 kg/m  GEN: Well nourished, well developed, in no acute distress, overweight HEENT: normal  Neck: no JVD, carotid bruits, or masses Cardiac: RRR; no murmurs, rubs, or gallops,no edema  Respiratory:  clear to auscultation bilaterally, normal work of breathing GI: soft, nontender, nondistended, + BS MS: no deformity or atrophy  Skin: warm and dry, no rash Neuro:  Alert and Oriented x 3, Strength and sensation are intact Psych: euthymic mood, full affect     EKG:  EKG was ordered today 11/23/2018 sinus bradycardia 57 with no other abnormalities.  Personally reviewed 08/04/15 - sinus rhythm, PACs, otherwise unremarkable. 03/20/14-normal rhythm, sinus arrhythmia, 61, no other abnormalities-previous showed Sinus bradycardia rate 58 with no other specific abnormalities  Echo:2012-EF 55%, questionable inferior hypokinesis, mild mitral valve regurgitation, grade 2 diastolic dysfunction.  Nuclear stress test 08/15/2015: Normal, no ischemia  Labs: LDL 83, triglycerides 129, hemoglobin A1c 6.2   ASSESSMENT AND PLAN:  1. Coronary artery disease-status post CABG/bypass surgery in 2012.  Nuclear stress test in 2017 was low risk, no ischemia.  Excellent.  This was done after he was having some shortness of breath with activity such as post hole digging.  Overall he is not complaining of any of the symptoms currently.  Continue with aggressive secondary prevention.  Aspirin, statin, diabetes control.  No anginal symptoms. 2. Hyperlipidemia-goal LDL 70. On simvastatin 20.  Dr. Garlon Hatchet according to the records changed to atorvastatin but it looks like he did not necessarily get that filled.  We will go ahead and change him to atorvastatin 40 mg, high intensity statin dose given his prior CABG.  In 3 months check a lipid panel and ALT   Overall doing very well, no myalgias. [employer] 3. Obesity-continue to encourage weight loss.  We discussed again at length.  Decreasing carbohydrates.  Vegetables.  Continue with daily walks. 4. HTN -  Lisinopril 5  Goal <130/80. Continue close f/u with PCP.  Doing well.  At home he has been doing quite well.   5. Diabetes-continue to encourage control of diet, per primary physician.  Metformin.  He states that his hemoglobin A1c has gone up since COVID  6. one-year follow-up.   Signed, Candee Furbish, MD [facility]  11/23/2018 11:36 AM

## 2018-11-23 NOTE — Patient Instructions (Signed)
Medication Instructions:  Please discontinue your Simvastatin and start Atorvastatin 40 mg daily. Continue all other medications as listed.  If you need a refill on your cardiac medications before your next appointment, please call your pharmacy.   Lab work: Please have fasting lab in 3 months (Lipid/ALT) If you have labs (blood work) drawn today and your tests are completely normal, you will receive your results only by: Marland Kitchen MyChart Message (if you have MyChart) OR . A paper copy in the mail If you have any lab test that is abnormal or we need to change your treatment, we will call you to review the results.  Follow-Up: At [facility], you and your health needs are our priority.  As part of our continuing mission to provide you with exceptional heart care, we have created designated Provider Care Teams.  These Care Teams include your primary Cardiologist (physician) and Advanced Practice Providers (APPs -  Physician Assistants and Nurse Practitioners) who all work together to provide you with the care you need, when you need it. You will need a follow up appointment in 12 months.  Please call our office 2 months in advance to schedule this appointment.  You may see Candee Furbish, MD or one of the following Advanced Practice Providers on your designated Care Team:   Truitt Merle, NP Cecilie Kicks, NP . Kathyrn Drown, NP  Thank you for choosing [facility]!!

## 2019-02-23 ENCOUNTER — Other Ambulatory Visit: Payer: Self-pay | Admitting: Nurse Practitioner

## 2019-02-28 ENCOUNTER — Other Ambulatory Visit: Payer: Medicare HMO | Admitting: *Deleted

## 2019-02-28 ENCOUNTER — Other Ambulatory Visit: Payer: Self-pay

## 2019-02-28 DIAGNOSIS — Z79899 Other long term (current) drug therapy: Secondary | ICD-10-CM

## 2019-02-28 DIAGNOSIS — E7800 Pure hypercholesterolemia, unspecified: Secondary | ICD-10-CM

## 2019-02-28 LAB — LIPID PANEL
Chol/HDL Ratio: 2.6 ratio (ref 0.0–5.0)
Cholesterol, Total: 95 mg/dL — ABNORMAL LOW (ref 100–199)
HDL: 36 mg/dL — ABNORMAL LOW (ref >39)
LDL Chol Calc (NIH): 40 mg/dL (ref 0–99)
Triglycerides: 101 mg/dL (ref 0–149)
VLDL Cholesterol Cal: 19 mg/dL (ref 5–40)

## 2019-02-28 LAB — ALT: ALT: 28 IU/L (ref 0–44)

## 2019-03-01 ENCOUNTER — Telehealth: Payer: Self-pay

## 2019-03-01 NOTE — Telephone Encounter (Signed)
The patient has been notified of the lab  results and verbalized understanding.  All questions (if any) were answered. Frederik Schmidt, RN 03/01/2019 10:16 AM

## 2019-03-01 NOTE — Telephone Encounter (Signed)
-----   Message from Jerline Pain, MD sent at 03/01/2019 10:10 AM EST ----- Excellent response to atorvastatin.  LDL 40.  ALT is normal, liver function is normal. Candee Furbish, MD

## 2019-09-19 ENCOUNTER — Other Ambulatory Visit: Payer: Self-pay | Admitting: Cardiology

## 2019-10-18 ENCOUNTER — Other Ambulatory Visit: Payer: Self-pay | Admitting: Nurse Practitioner

## 2019-10-18 ENCOUNTER — Other Ambulatory Visit: Payer: Self-pay | Admitting: Cardiology

## 2019-12-24 ENCOUNTER — Ambulatory Visit: Payer: Medicare HMO | Admitting: Cardiology

## 2020-01-11 ENCOUNTER — Other Ambulatory Visit: Payer: Self-pay

## 2020-01-11 ENCOUNTER — Encounter: Payer: Self-pay | Admitting: Cardiology

## 2020-01-11 ENCOUNTER — Ambulatory Visit: Payer: Medicare HMO | Admitting: Cardiology

## 2020-01-11 VITALS — BP 152/70 | HR 57 | Ht 66.0 in | Wt 209.0 lb

## 2020-01-11 DIAGNOSIS — I2510 Atherosclerotic heart disease of native coronary artery without angina pectoris: Secondary | ICD-10-CM

## 2020-01-11 DIAGNOSIS — E7800 Pure hypercholesterolemia, unspecified: Secondary | ICD-10-CM

## 2020-01-11 DIAGNOSIS — E119 Type 2 diabetes mellitus without complications: Secondary | ICD-10-CM | POA: Diagnosis not present

## 2020-01-11 DIAGNOSIS — I10 Essential (primary) hypertension: Secondary | ICD-10-CM | POA: Diagnosis not present

## 2020-01-11 NOTE — Progress Notes (Signed)
Cardiology Office Note:    Date:  01/11/2020   ID:  [name], DOB Oct 31, 1940, MRN [id number]  PCP:  Charles Greenhouse, MD  [facility] Cardiologist:  Charles Furbish, MD  [facility] Electrophysiologist:  None   Referring MD: Charles Greenhouse, MD     History of Present Illness:    [name] is a 79 y.o. male here for the follow-up of coronary artery disease status post bypass surgery.  CABG 03/27/2010-four-vessel. Diabetes Hypertension Hyperlipidemia  Overall been feeling quite well.  No fevers chills nausea vomiting syncope bleeding.  Compliant with his medications.  Occasionally when laying down at night he may feel a flutter-like sensation to his back, could be PVCs or PACs.  He still occasionally will climb a tree with his grandkids.  Encouraged him not to do this.  Worry about him falling.  He did tell me a story how he was swinging on a rope a few years ago and fell face first into the gravel driveway.  [place].  Past Medical History:  Diagnosis Date  . CAD (coronary artery disease)    CAD-CABG x4(03/27/10 [name]) LIMA to LAD, SVG to PDA and posterior lateral branches, SVG to first diagonal  . Coronary atherosclerosis of native coronary artery   . Diabetes ([place])    A1c in hospital 6.8  . Dyslipidemia   . Obesity, unspecified   . Shortness of breath     Past Surgical History:  Procedure Laterality Date  . CORONARY ARTERY BYPASS GRAFT     CAD-CABG x4(03/27/10 [name]) LIMA to LAD, SVG to PDA and posterior lateral branches, SVG to first diagonal  . HERNIA REPAIR      Current Medications: Current Meds  Medication Sig  . aspirin 81 MG tablet Take 1 tablet (81 mg total) by mouth daily.  Marland Kitchen atorvastatin (LIPITOR) 40 MG tablet Take 1 tablet (40 mg total) by mouth daily. Please make yearly appt with Dr. Marlou Haley for October for future refills. 1st attempt  . Blood Glucose Monitoring Suppl (ONE TOUCH ULTRA MINI) W/DEVICE  KIT   . CETIRIZINE HCL PO Apply 1 application topically as needed for itching.  Marland Kitchen lisinopril (ZESTRIL) 20 MG tablet Take 1 tablet (20 mg total) by mouth daily. Must schedule follow up appt for further refills. 1st attempt  . metFORMIN (GLUCOPHAGE) 1000 MG tablet Take 1,000 mg by mouth 2 (two) times daily with a meal.   . metoprolol tartrate (LOPRESSOR) 25 MG tablet Take 12.5 mg by mouth 2 (two) times daily.  . ONE TOUCH ULTRA TEST test strip 1 each by Other route daily.   [name] DELICA LANCETS FINE MISC 1 each daily.      Allergies:   Patient has no known allergies.   Social History   Socioeconomic History  . Marital status: Married    Spouse name: Not on file  . Number of children: Not on file  . Years of education: Not on file  . Highest education level: Not on file  Occupational History  . Not on file  Tobacco Use  . Smoking status: Never Smoker  . Smokeless tobacco: Never Used  Vaping Use  . Vaping Use: Never used  Substance and Sexual Activity  . Alcohol use: No  . Drug use: No  . Sexual activity: Never  Other Topics Concern  . Not on file  Social History Narrative  . Not on file   Social Determinants of  Health   Financial Resource Strain:   . Difficulty of Paying Living Expenses: Not on file  Food Insecurity:   . Worried About Charity fundraiser in the Last Year: Not on file  . Ran Out of Food in the Last Year: Not on file  Transportation Needs:   . Lack of Transportation (Medical): Not on file  . Lack of Transportation (Non-Medical): Not on file  Physical Activity:   . Days of Exercise per Week: Not on file  . Minutes of Exercise per Session: Not on file  Stress:   . Feeling of Stress : Not on file  Social Connections:   . Frequency of Communication with Friends and Family: Not on file  . Frequency of Social Gatherings with Friends and Family: Not on file  . Attends Religious Services: Not on file  . Active Member of Clubs or Organizations: Not on  file  . Attends Archivist Meetings: Not on file  . Marital Status: Not on file     Family History: The patient's family history includes COPD in his father; Cancer - Lung in his father. There is no history of Heart attack or Stroke.  ROS:   Please see the history of present illness.    No fevers chills nausea vomiting syncope bleeding all other systems reviewed and are negative.  EKGs/Labs/Other Studies Reviewed:    The following studies were reviewed today:  Nuclear stress test 2017-no ischemia low risk  Echocardiogram 2012 EF 55 possible inferior hypokinesis grade 2 diastolic dysfunction  Prior labs LDL 83 hemoglobin A1c 6.2  EKG:  EKG is  ordered today.  The ekg ordered today demonstrates sinus bradycardia 57 no other abnormalities prior EKG showed sinus bradycardia with no other abnormalities  Recent Labs: 02/28/2019: ALT 28  Recent Lipid Panel    Component Value Date/Time   CHOL 95 (L) 02/28/2019 0754   TRIG 101 02/28/2019 0754   HDL 36 (L) 02/28/2019 0754   CHOLHDL 2.6 02/28/2019 0754   LDLCALC 40 02/28/2019 0754     Risk Assessment/Calculations:       Physical Exam:    VS:  BP (!) 152/70   Pulse (!) 57   Ht 5' 6"  (1.676 m)   Wt 209 lb (94.8 kg)   SpO2 95%   BMI 33.73 kg/m     Wt Readings from Last 3 Encounters:  01/11/20 209 lb (94.8 kg)  11/23/18 208 lb (94.3 kg)  09/06/17 205 lb (93 kg)     GEN:  Well nourished, well developed in no acute distress HEENT: Normal NECK: No JVD; No carotid bruits LYMPHATICS: No lymphadenopathy CARDIAC: RRR, no murmurs, rubs, gallops RESPIRATORY:  Clear to auscultation without rales, wheezing or rhonchi  ABDOMEN: Soft, non-tender, non-distended MUSCULOSKELETAL:  No edema; No deformity  SKIN: Warm and dry NEUROLOGIC:  Alert and oriented x 3 PSYCHIATRIC:  Normal affect   ASSESSMENT:    1. Atherosclerosis of native coronary artery of native heart without angina pectoris   2. Diabetes mellitus with  coincident hypertension ([place])   3. Pure hypercholesterolemia    PLAN:    In order of problems listed above:  Coronary artery disease status post CABG 2012 -Prior nuclear stress test in 2017 was low risk with no ischemia, reviewed.  Excellent. -No recent anginal symptoms.  Continuing with aspirin statin good diabetes control.  Hyperlipidemia -Goal LDL 70.  On simvastatin 20 previously, I change him to atorvastatin 40, high intensity statin given his prior CABG. last  LDL 40.  Excellent on 02/28/2019.  Continue with current medication management  Essential hypertension -Lisinopril, goal less than 130/80.  Doing very well.  20 mg of lisinopril.  Diabetes with hypertension -Continue good diabetes control.  Metformin.  Jardiance would be good with his concomitant coronary artery disease.  Last hemoglobin A1c 6.5 from outside labs.    Shared Decision Making/Informed Consent        Medication Adjustments/Labs and Tests Ordered: Current medicines are reviewed at length with the patient today.  Concerns regarding medicines are outlined above.  Orders Placed This Encounter  Procedures  . EKG 12-Lead   No orders of the defined types were placed in this encounter.   Patient Instructions  Medication Instructions:  The current medical regimen is effective;  continue present plan and medications.  *If you need a refill on your cardiac medications before your next appointment, please call your pharmacy*  Follow-Up: At [facility], you and your health needs are our priority.  As part of our continuing mission to provide you with exceptional heart care, we have created designated Provider Care Teams.  These Care Teams include your primary Cardiologist (physician) and Advanced Practice Providers (APPs -  Physician Assistants and Nurse Practitioners) who all work together to provide you with the care you need, when you need it.  We recommend signing up for the patient portal called "MyChart".   Sign up information is provided on this After Visit Summary.  MyChart is used to connect with patients for Virtual Visits (Telemedicine).  Patients are able to view lab/test results, encounter notes, upcoming appointments, etc.  Non-urgent messages can be sent to your provider as well.   To learn more about what you can do with MyChart, go to [email or web address].    Your next appointment:   12 month(s)  The format for your next appointment:   In Person  Provider:   Candee Furbish, MD   Thank you for choosing [facility]!!        Signed, Charles Furbish, MD  01/11/2020 9:27 AM    [place]

## 2020-01-11 NOTE — Patient Instructions (Signed)
Medication Instructions:  The current medical regimen is effective;  continue present plan and medications.  *If you need a refill on your cardiac medications before your next appointment, please call your pharmacy*  Follow-Up: At [facility], you and your health needs are our priority.  As part of our continuing mission to provide you with exceptional heart care, we have created designated Provider Care Teams.  These Care Teams include your primary Cardiologist (physician) and Advanced Practice Providers (APPs -  Physician Assistants and Nurse Practitioners) who all work together to provide you with the care you need, when you need it.  We recommend signing up for the patient portal called "MyChart".  Sign up information is provided on this After Visit Summary.  MyChart is used to connect with patients for Virtual Visits (Telemedicine).  Patients are able to view lab/test results, encounter notes, upcoming appointments, etc.  Non-urgent messages can be sent to your provider as well.   To learn more about what you can do with MyChart, go to [email or web address].    Your next appointment:   12 month(s)  The format for your next appointment:   In Person  Provider:   Mark Skains, MD   Thank you for choosing [facility]!!      

## 2020-02-24 ENCOUNTER — Other Ambulatory Visit: Payer: Self-pay | Admitting: Cardiology

## 2020-04-21 ENCOUNTER — Other Ambulatory Visit: Payer: Self-pay

## 2020-04-21 MED ORDER — ATORVASTATIN CALCIUM 40 MG PO TABS: 40.0000 mg | ORAL_TABLET | Freq: Every day | ORAL | 2 refills | Status: DC

## 2021-01-11 ENCOUNTER — Other Ambulatory Visit: Payer: Self-pay | Admitting: Cardiology

## 2021-01-17 ENCOUNTER — Other Ambulatory Visit: Payer: Self-pay | Admitting: Cardiology

## 2021-01-20 ENCOUNTER — Encounter (HOSPITAL_BASED_OUTPATIENT_CLINIC_OR_DEPARTMENT_OTHER): Payer: Self-pay | Admitting: Cardiology

## 2021-01-20 ENCOUNTER — Ambulatory Visit (HOSPITAL_BASED_OUTPATIENT_CLINIC_OR_DEPARTMENT_OTHER): Payer: Medicare HMO | Admitting: Cardiology

## 2021-01-20 ENCOUNTER — Other Ambulatory Visit: Payer: Self-pay

## 2021-01-20 DIAGNOSIS — I2510 Atherosclerotic heart disease of native coronary artery without angina pectoris: Secondary | ICD-10-CM | POA: Diagnosis not present

## 2021-01-20 DIAGNOSIS — E7800 Pure hypercholesterolemia, unspecified: Secondary | ICD-10-CM

## 2021-01-20 NOTE — Patient Instructions (Signed)
Medication Instructions:  The current medical regimen is effective;  continue present plan and medications.  *If you need a refill on your cardiac medications before your next appointment, please call your pharmacy*  Follow-Up: At [facility], you and your health needs are our priority.  As part of our continuing mission to provide you with exceptional heart care, we have created designated Provider Care Teams.  These Care Teams include your primary Cardiologist (physician) and Advanced Practice Providers (APPs -  Physician Assistants and Nurse Practitioners) who all work together to provide you with the care you need, when you need it.  We recommend signing up for the patient portal called "MyChart".  Sign up information is provided on this After Visit Summary.  MyChart is used to connect with patients for Virtual Visits (Telemedicine).  Patients are able to view lab/test results, encounter notes, upcoming appointments, etc.  Non-urgent messages can be sent to your provider as well.   To learn more about what you can do with MyChart, go to [email or web address].    Your next appointment:   1 year(s)  The format for your next appointment:   In Person  Provider:   Mark Skains, MD   Thank you for choosing [facility]!!    

## 2021-01-20 NOTE — Progress Notes (Signed)
Cardiology Office Note:    Date:  01/20/2021   ID:  [name], DOB [birth year], MRN [id number]  PCP:  Algis Greenhouse, MD  [facility] Cardiologist:  Candee Furbish, MD  [facility] Electrophysiologist:  None   Referring MD: Algis Greenhouse, MD    History of Present Illness:    [name] is a 80 y.o. male here for the follow-up of coronary artery disease and hyperlipidemia.  CABG 03/27/2010-four-vessel. Diabetes Hypertension Hyperlipidemia  At his last appointment he had been feeling quite well. Compliant with his medications.  Occasionally when laying down at night he may feel a flutter-like sensation to his back, could be PVCs or PACs.  He still occasionally will climb a tree with his grandkids.  Encouraged him not to do this.  Worry about him falling.  He did tell me a story how he was swinging on a rope a few years ago and fell face first into the gravel driveway.  [place]  Today: He is accompanied by his wife. It is her birthday today.  Overall, he has been feeling very good. He confirms his heart rate is usually in the 50s. His heart rate will elevate with heavy exertion outside, such as digging. However he does recover quickly.  He followed up with his PCP last week, lab work was updated.  He denies any palpitations, chest pain, or shortness of breath. No lightheadedness, headaches, syncope, orthopnea, PND, or lower extremity edema.  Past Medical History:  Diagnosis Date   CAD (coronary artery disease)    CAD-CABG x4(03/27/10 [place]) LIMA to LAD, SVG to PDA and posterior lateral branches, SVG to first diagonal   Coronary atherosclerosis of native coronary artery    Diabetes ([facility])    A1c in hospital 6.8   Dyslipidemia    Obesity, unspecified    Shortness of breath     Past Surgical History:  Procedure Laterality Date   CORONARY ARTERY BYPASS GRAFT     CAD-CABG x4(03/27/10 [place]) LIMA to LAD, SVG to PDA  and posterior lateral branches, SVG to first diagonal   HERNIA REPAIR      Current Medications: Current Meds  Medication Sig   aspirin 81 MG tablet Take 1 tablet (81 mg total) by mouth daily.   atorvastatin (LIPITOR) 40 MG tablet TAKE 1 TABLET EVERY DAY   Blood Glucose Monitoring Suppl (ONE TOUCH ULTRA MINI) W/DEVICE KIT    CETIRIZINE HCL PO Apply 1 application topically as needed for itching.   lisinopril (ZESTRIL) 20 MG tablet Take 1 tablet (20 mg total) by mouth daily.   metFORMIN (GLUCOPHAGE) 1000 MG tablet Take 1,000 mg by mouth 2 (two) times daily with a meal.    metoprolol tartrate (LOPRESSOR) 25 MG tablet Take 12.5 mg by mouth 2 (two) times daily.   ONE TOUCH ULTRA TEST test strip 1 each by Other route daily.    ONETOUCH DELICA LANCETS FINE MISC 1 each daily.      Allergies:   Patient has no known allergies.   Social History   Socioeconomic History   Marital status: Married    Spouse name: Not on file   Number of children: Not on file   Years of education: Not on file   Highest education level: Not on file  Occupational History   Not on file  Tobacco Use   Smoking status: Never   Smokeless tobacco: Never  Vaping Use   Vaping Use:  Never used  Substance and Sexual Activity   Alcohol use: No   Drug use: No   Sexual activity: Never  Other Topics Concern   Not on file  Social History Narrative   Not on file   Social Determinants of Health   Financial Resource Strain: Not on file  Food Insecurity: Not on file  Transportation Needs: Not on file  Physical Activity: Not on file  Stress: Not on file  Social Connections: Not on file     Family History: The patient's family history includes COPD in his father; Cancer - Lung in his father. There is no history of Heart attack or Stroke.  ROS:   Please see the history of present illness.    All other systems reviewed and are negative.  EKGs/Labs/Other Studies Reviewed:    The following studies were reviewed  today:  Nuclear stress test 2017: Nuclear stress EF: 55%. Paradoxical septal motion. Downsloping ST segment depression ST segment depression of 1 mm was noted during stress in the II, III, V4, V5 and V6 leads, beginning at 2 minutes of stress, ending at 1 minutes of stress, and returning to baseline after less than 1 minute of recovery. Blood pressure demonstrated a hypertensive response to exercise with peak BP 215/83 mmHg. The study is normal. This is a low risk study. The left ventricular ejection fraction is normal (55-65%).   This is a normal exercise nuclear stress test with no evidence for prior infarct or ischemia.  Echocardiogram 2012:  EF 55 possible inferior hypokinesis grade 2 diastolic dysfunction  EKG:  EKG is personally reviewed and interpreted. 01/20/2021: Sinus bradycardia. Rate 50 bpm.  01/11/2020: sinus bradycardia 57 no other abnormalities  Prior EKG showed sinus bradycardia with no other abnormalities  Recent Labs: No results found for requested labs within last 8760 hours.   Prior labs LDL 83 hemoglobin A1c 6.2  Recent Lipid Panel    Component Value Date/Time   CHOL 95 (L) 02/28/2019 0754   TRIG 101 02/28/2019 0754   HDL 36 (L) 02/28/2019 0754   CHOLHDL 2.6 02/28/2019 0754   LDLCALC 40 02/28/2019 0754     Risk Assessment/Calculations:       Physical Exam:    VS:  BP 138/70   Pulse (!) 50   Ht 5' 6"  (1.676 m)   Wt 211 lb (95.7 kg)   SpO2 96%   BMI 34.06 kg/m     Wt Readings from Last 3 Encounters:  01/20/21 211 lb (95.7 kg)  01/11/20 209 lb (94.8 kg)  11/23/18 208 lb (94.3 kg)     GEN:  Well nourished, well developed in no acute distress HEENT: Normal NECK: No JVD; No carotid bruits LYMPHATICS: No lymphadenopathy CARDIAC: RRR, no murmurs, rubs, gallops RESPIRATORY:  Clear to auscultation without rales, wheezing or rhonchi  ABDOMEN: Soft, non-tender, non-distended MUSCULOSKELETAL:  No edema; No deformity  SKIN: Warm and  dry NEUROLOGIC:  Alert and oriented x 3 PSYCHIATRIC:  Normal affect   ASSESSMENT:    1. Coronary artery disease involving native coronary artery of native heart without angina pectoris   2. Pure hypercholesterolemia     PLAN:    In order of problems listed above:  Coronary artery disease involving native coronary artery of native heart without angina pectoris Had prior bypass surgery in 2012, four-vessel.  Nuclear stress test in 2017 was low risk with no ischemia.  Reviewed results.  Excellent.  Currently continue with goal-directed medical therapy which includes statin aspirin as  well as well-controlled diabetes.  If necessary, could consider Jardiance for his diabetic regimen as well which has cardiovascular benefits.  He recently had blood work done by Dr. Garlon Hatchet.  Excellent.  Continuing to monitor lipids.  Type II or unspecified type diabetes mellitus without mention of complication, not stated as uncontrolled Overall doing well.  Prior hemoglobin A1c 6.5.  Reviewed from outside labs.  Continue per Dr. Garlon Hatchet management.  Pure hypercholesterolemia Atorvastatin 40 mg once a day, high intensity dose.  Excellent.  LDL goal less than 70.  Previously prior LDL was 40.  Continue with current medical management.  No myalgias.     Shared Decision Making/Informed Consent      Follow-up: 1 year.  Medication Adjustments/Labs and Tests Ordered: Current medicines are reviewed at length with the patient today.  Concerns regarding medicines are outlined above.   Orders Placed This Encounter  Procedures   EKG 12-Lead    No orders of the defined types were placed in this encounter.  Patient Instructions  Medication Instructions:  The current medical regimen is effective;  continue present plan and medications.  *If you need a refill on your cardiac medications before your next appointment, please call your pharmacy*  Follow-Up: At [facility], you and your health needs are our  priority.  As part of our continuing mission to provide you with exceptional heart care, we have created designated Provider Care Teams.  These Care Teams include your primary Cardiologist (physician) and Advanced Practice Providers (APPs -  Physician Assistants and Nurse Practitioners) who all work together to provide you with the care you need, when you need it.  We recommend signing up for the patient portal called "MyChart".  Sign up information is provided on this After Visit Summary.  MyChart is used to connect with patients for Virtual Visits (Telemedicine).  Patients are able to view lab/test results, encounter notes, upcoming appointments, etc.  Non-urgent messages can be sent to your provider as well.   To learn more about what you can do with MyChart, go to [email or web address].    Your next appointment:   1 year(s)  The format for your next appointment:   In Person  Provider:   Candee Furbish, MD     Thank you for choosing [place]!!     I,Mathew Stumpf,acting as a scribe for Candee Furbish, MD.,have documented all relevant documentation on the behalf of Candee Furbish, MD,as directed by  Candee Furbish, MD while in the presence of Candee Furbish, MD.  I, Candee Furbish, MD, have reviewed all documentation for this visit. The documentation on 01/20/21 for the exam, diagnosis, procedures, and orders are all accurate and complete.   Signed, Candee Furbish, MD  01/20/2021 10:01 AM    [facility]

## 2021-01-20 NOTE — Assessment & Plan Note (Addendum)
Had prior bypass surgery in 2012, four-vessel.  Nuclear stress test in 2017 was low risk with no ischemia.  Reviewed results.  Excellent.  Currently continue with goal-directed medical therapy which includes statin aspirin as well as well-controlled diabetes.  If necessary, could consider Jardiance for his diabetic regimen as well which has cardiovascular benefits.  He recently had blood work done by Dr. Garlon Hatchet.  Excellent.  Continuing to monitor lipids.

## 2021-01-20 NOTE — Assessment & Plan Note (Signed)
Atorvastatin 40 mg once a day, high intensity dose.  Excellent.  LDL goal less than 70.  Previously prior LDL was 40.  Continue with current medical management.  No myalgias.

## 2021-01-20 NOTE — Assessment & Plan Note (Signed)
Overall doing well.  Prior hemoglobin A1c 6.5.  Reviewed from outside labs.  Continue per Dr. Garlon Hatchet management.

## 2021-06-29 ENCOUNTER — Other Ambulatory Visit: Payer: Self-pay | Admitting: Cardiology

## 2022-02-16 ENCOUNTER — Other Ambulatory Visit: Payer: Self-pay

## 2022-02-16 MED ORDER — LISINOPRIL 20 MG PO TABS: 20.0000 mg | ORAL_TABLET | Freq: Every day | ORAL | 0 refills | Status: DC

## 2022-02-16 MED ORDER — ATORVASTATIN CALCIUM 40 MG PO TABS: ORAL_TABLET | ORAL | 0 refills | Status: DC

## 2022-03-01 ENCOUNTER — Ambulatory Visit: Payer: Medicare HMO | Attending: Cardiology | Admitting: Cardiology

## 2022-03-01 ENCOUNTER — Encounter: Payer: Self-pay | Admitting: Cardiology

## 2022-03-01 VITALS — BP 140/60 | HR 48 | Ht 66.0 in | Wt 205.0 lb

## 2022-03-01 DIAGNOSIS — I10 Essential (primary) hypertension: Secondary | ICD-10-CM | POA: Diagnosis not present

## 2022-03-01 DIAGNOSIS — Z79899 Other long term (current) drug therapy: Secondary | ICD-10-CM | POA: Diagnosis not present

## 2022-03-01 DIAGNOSIS — I2510 Atherosclerotic heart disease of native coronary artery without angina pectoris: Secondary | ICD-10-CM | POA: Diagnosis not present

## 2022-03-01 DIAGNOSIS — E7800 Pure hypercholesterolemia, unspecified: Secondary | ICD-10-CM | POA: Diagnosis not present

## 2022-03-01 LAB — CBC

## 2022-03-01 NOTE — Patient Instructions (Signed)
Medication Instructions:  The current medical regimen is effective;  continue present plan and medications.  *If you need a refill on your cardiac medications before your next appointment, please call your pharmacy*  Lab Work: Please have blood work today (CBC, CMP, Lipid and Ha1c)  If you have labs (blood work) drawn today and your tests are completely normal, you will receive your results only by: [place] (if you have [place]) OR A paper copy in the mail If you have any lab test that is abnormal or we need to change your treatment, we will call you to review the results.  Follow-Up: At [facility], you and your health needs are our priority.  As part of our continuing mission to provide you with exceptional heart care, we have created designated Provider Care Teams.  These Care Teams include your primary Cardiologist (physician) and Advanced Practice Providers (APPs -  Physician Assistants and Nurse Practitioners) who all work together to provide you with the care you need, when you need it.  We recommend signing up for the patient portal called "MyChart".  Sign up information is provided on this After Visit Summary.  MyChart is used to connect with patients for Virtual Visits (Telemedicine).  Patients are able to view lab/test results, encounter notes, upcoming appointments, etc.  Non-urgent messages can be sent to your provider as well.   To learn more about what you can do with MyChart, go to [email or web address].    Your next appointment:   1 year(s)  The format for your next appointment:   In Person  Provider:   Candee Furbish, MD      Important Information About Sugar

## 2022-03-01 NOTE — Progress Notes (Signed)
Cardiology Office Note:    Date:  03/01/2022   ID:  [name], DOB 1940/03/03, MRN [id number]  PCP:  Algis Greenhouse, MD  [facility] Cardiologist:  Candee Furbish, MD  [facility] Electrophysiologist:  None   Referring MD: Algis Greenhouse, MD    History of Present Illness:    [name] is a 82 y.o. male here for the follow-up of coronary artery disease and hyperlipidemia.  CABG 03/27/2010-four-vessel. Diabetes Hypertension Hyperlipidemia  His daughter is with him today.  She tells the story that she had a nightmare that he had had a heart attack and died and ended up calling our office and got him into see me in 2012.  He then went to bypass surgery.  Her son is with her today, enjoys wrestling baseball football.  Has lost 35 pounds.  Compliant with his medications.  Occasionally when laying down at night he may feel a flutter-like sensation to his back, could be PVCs or PACs.  Has had some dietary indiscretions at times, country ham, salt can increase his blood pressure.  One of his daughter sneaks this to him.  He does state that when he got home from his bypass his daughter gave him a Orthoptist from [employer].  His other daughter said no way.  He no longer climbs a tree with his grandkids.  Encouraged him not to do this.  Worry about him falling.  He did tell me a story how he was swinging on a rope a few years ago and fell face first into the gravel driveway.  Here with their grandson Cristie Hem.  They unfortunately were a evicted fairly suddenly over the December 2023 from their duplex.  They live with their daughter.  [place].  He confirms his heart rate is usually in the 50s. His heart rate will elevate with heavy exertion outside, such as digging. However he does recover quickly.  He followed up with his PCP last week, lab work was updated.  He denies any palpitations, chest pain, or shortness of breath. No  lightheadedness, headaches, syncope, orthopnea, PND, or lower extremity edema.  Past Medical History:  Diagnosis Date   CAD (coronary artery disease)    CAD-CABG x4(03/27/10 [place]) LIMA to LAD, SVG to PDA and posterior lateral branches, SVG to first diagonal   Coronary atherosclerosis of native coronary artery    Diabetes (HCC)    A1c in hospital 6.8   Dyslipidemia    Obesity, unspecified    Shortness of breath     Past Surgical History:  Procedure Laterality Date   CORONARY ARTERY BYPASS GRAFT     CAD-CABG x4(03/27/10 [place]) LIMA to LAD, SVG to PDA and posterior lateral branches, SVG to first diagonal   HERNIA REPAIR      Current Medications: Current Meds  Medication Sig   aspirin 81 MG tablet Take 1 tablet (81 mg total) by mouth daily.   atorvastatin (LIPITOR) 40 MG tablet TAKE 1 TABLET EVERY DAY   Blood Glucose Monitoring Suppl (ONE TOUCH ULTRA MINI) W/DEVICE KIT    CETIRIZINE HCL PO Apply 1 application topically as needed for itching.   lisinopril (ZESTRIL) 20 MG tablet Take 1 tablet (20 mg total) by mouth daily.   metFORMIN (GLUCOPHAGE) 1000 MG tablet Take 1,000 mg by mouth 2 (two) times daily with a meal.    metoprolol tartrate (LOPRESSOR) 25 MG tablet Take 12.5 mg by mouth 2 (two) times  daily.   ONE TOUCH ULTRA TEST test strip 1 each by Other route daily.    ONETOUCH DELICA LANCETS FINE MISC 1 each daily.      Allergies:   Patient has no known allergies.   Social History   Socioeconomic History   Marital status: Married    Spouse name: Not on file   Number of children: Not on file   Years of education: Not on file   Highest education level: Not on file  Occupational History   Not on file  Tobacco Use   Smoking status: Never   Smokeless tobacco: Never  Vaping Use   Vaping Use: Never used  Substance and Sexual Activity   Alcohol use: No   Drug use: No   Sexual activity: Never  Other Topics Concern   Not on file  Social History Narrative   Not on file    Social Determinants of Health   Financial Resource Strain: Not on file  Food Insecurity: Not on file  Transportation Needs: Not on file  Physical Activity: Not on file  Stress: Not on file  Social Connections: Not on file     Family History: The patient's family history includes COPD in his father; Cancer - Lung in his father. There is no history of Heart attack or Stroke.  ROS:   Please see the history of present illness.    All other systems reviewed and are negative.  EKGs/Labs/Other Studies Reviewed:    The following studies were reviewed today:  Nuclear stress test 2017: Nuclear stress EF: 55%. Paradoxical septal motion. Downsloping ST segment depression ST segment depression of 1 mm was noted during stress in the II, III, V4, V5 and V6 leads, beginning at 2 minutes of stress, ending at 1 minutes of stress, and returning to baseline after less than 1 minute of recovery. Blood pressure demonstrated a hypertensive response to exercise with peak BP 215/83 mmHg. The study is normal. This is a low risk study. The left ventricular ejection fraction is normal (55-65%).   This is a normal exercise nuclear stress test with no evidence for prior infarct or ischemia.  Echocardiogram 2012:  EF 55 possible inferior hypokinesis grade 2 diastolic dysfunction  EKG:  EKG is personally reviewed and interpreted. 03/01/2022: Sinus bradycardia 48 normal intervals otherwise.  Asymptomatic. 01/20/2021: Sinus bradycardia. Rate 50 bpm.  01/11/2020: sinus bradycardia 57 no other abnormalities  Prior EKG showed sinus bradycardia with no other abnormalities  Recent Labs: No results found for requested labs within last 365 days.   Prior labs LDL 83 hemoglobin A1c 6.2  Recent Lipid Panel    Component Value Date/Time   CHOL 95 (L) 02/28/2019 0754   TRIG 101 02/28/2019 0754   HDL 36 (L) 02/28/2019 0754   CHOLHDL 2.6 02/28/2019 0754   LDLCALC 40 02/28/2019 0754     Risk  Assessment/Calculations:       Physical Exam:    VS:  BP (!) 140/60 (BP Location: Left Arm, Patient Position: Sitting, Cuff Size: Normal)   Pulse (!) 48   Ht '5\' 6"'$  (1.676 m)   Wt 205 lb (93 kg)   BMI 33.09 kg/m     Wt Readings from Last 3 Encounters:  03/01/22 205 lb (93 kg)  01/20/21 211 lb (95.7 kg)  01/11/20 209 lb (94.8 kg)     GEN:  Well nourished, well developed in no acute distress HEENT: Normal NECK: No JVD; No carotid bruits LYMPHATICS: No lymphadenopathy CARDIAC: Loletha Grayer reg, no  murmurs, rubs, gallops RESPIRATORY:  Clear to auscultation without rales, wheezing or rhonchi  ABDOMEN: Soft, non-tender, non-distended MUSCULOSKELETAL:  No edema; No deformity  SKIN: Warm and dry NEUROLOGIC:  Alert and oriented x 3 PSYCHIATRIC:  Normal affect   ASSESSMENT:    1. Coronary artery disease involving native coronary artery of native heart without angina pectoris   2. Pure hypercholesterolemia   3. Essential hypertension   4. Medication management      PLAN:    In order of problems listed above:   Coronary artery disease involving native coronary artery of native heart without angina pectoris Had prior CABG in 2012, four-vessel.  Nuclear stress test in 2017 was low risk with no ischemia. Currently continue with goal-directed medical therapy which includes statin aspirin as well as well-controlled diabetes.  If necessary, could consider Jardiance for his diabetic regimen as well which has cardiovascular benefits.  On metformin.  Sees Dr. Garlon Hatchet.  Excellent. Continuing to monitor lipids.   Type II or unspecified type diabetes mellitus without mention of complication, not stated as uncontrolled Overall doing well.  Prior hemoglobin A1c 6.5.  Reviewed from outside labs.  No changes made in medication management.   Pure hypercholesterolemia Atorvastatin 40 mg once a day, high intensity dose.  Excellent.  LDL goal less than 70. Previously prior LDL was 40.  Continue with  current medical management.  No myalgias.  We will continue to monitor.  Checking lab work today.    Shared Decision Making/Informed Consent      Follow-up: 1 year.  Medication Adjustments/Labs and Tests Ordered: Current medicines are reviewed at length with the patient today.  Concerns regarding medicines are outlined above.   Orders Placed This Encounter  Procedures   Hemoglobin A1c   CBC   Comprehensive metabolic panel   Lipid panel   EKG 12-Lead    No orders of the defined types were placed in this encounter.   Patient Instructions  Medication Instructions:  The current medical regimen is effective;  continue present plan and medications.  *If you need a refill on your cardiac medications before your next appointment, please call your pharmacy*  Lab Work: Please have blood work today (CBC, CMP, Lipid and Ha1c)  If you have labs (blood work) drawn today and your tests are completely normal, you will receive your results only by: [facility] (if you have [place]) OR A paper copy in the mail If you have any lab test that is abnormal or we need to change your treatment, we will call you to review the results.  Follow-Up: At [facility], you and your health needs are our priority.  As part of our continuing mission to provide you with exceptional heart care, we have created designated Provider Care Teams.  These Care Teams include your primary Cardiologist (physician) and Advanced Practice Providers (APPs -  Physician Assistants and Nurse Practitioners) who all work together to provide you with the care you need, when you need it.  We recommend signing up for the patient portal called "MyChart".  Sign up information is provided on this After Visit Summary.  MyChart is used to connect with patients for Virtual Visits (Telemedicine).  Patients are able to view lab/test results, encounter notes, upcoming appointments, etc.  Non-urgent messages can be sent to your  provider as well.   To learn more about what you can do with MyChart, go to [email or web address].    Your next appointment:   1 year(s)  The format  for your next appointment:   In Person  Provider:   Candee Furbish, MD      Important Information About Sugar        .   Signed, Candee Furbish, MD  03/01/2022 9:34 AM    [facility]

## 2022-03-02 LAB — COMPREHENSIVE METABOLIC PANEL
ALT: 22 IU/L (ref 0–44)
AST: 18 IU/L (ref 0–40)
Albumin/Globulin Ratio: 1.4 (ref 1.2–2.2)
Albumin: 4.4 g/dL (ref 3.7–4.7)
Alkaline Phosphatase: 96 IU/L (ref 44–121)
BUN/Creatinine Ratio: 12 (ref 10–24)
BUN: 12 mg/dL (ref 8–27)
Bilirubin Total: 0.8 mg/dL (ref 0.0–1.2)
CO2: 23 mmol/L (ref 20–29)
Calcium: 9.2 mg/dL (ref 8.6–10.2)
Chloride: 102 mmol/L (ref 96–106)
Creatinine, Ser: 1.03 mg/dL (ref 0.76–1.27)
Globulin, Total: 3.2 g/dL (ref 1.5–4.5)
Glucose: 105 mg/dL — ABNORMAL HIGH (ref 70–99)
Potassium: 4.5 mmol/L (ref 3.5–5.2)
Sodium: 139 mmol/L (ref 134–144)
Total Protein: 7.6 g/dL (ref 6.0–8.5)
eGFR: 73 mL/min/{1.73_m2} (ref >59)

## 2022-03-02 LAB — LIPID PANEL
Chol/HDL Ratio: 2.5 ratio (ref 0.0–5.0)
Cholesterol, Total: 99 mg/dL — ABNORMAL LOW (ref 100–199)
HDL: 40 mg/dL (ref >39)
LDL Chol Calc (NIH): 42 mg/dL (ref 0–99)
Triglycerides: 82 mg/dL (ref 0–149)
VLDL Cholesterol Cal: 17 mg/dL (ref 5–40)

## 2022-03-02 LAB — CBC
Hematocrit: 36.6 % — ABNORMAL LOW (ref 37.5–51.0)
Hemoglobin: 12.3 g/dL — ABNORMAL LOW (ref 13.0–17.7)
MCH: 28.9 pg (ref 26.6–33.0)
MCHC: 33.6 g/dL (ref 31.5–35.7)
MCV: 86 fL (ref 79–97)
Platelets: 186 10*3/uL (ref 150–450)
RBC: 4.25 x10E6/uL (ref 4.14–5.80)
RDW: 14.3 % (ref 11.6–15.4)
WBC: 4.9 10*3/uL (ref 3.4–10.8)

## 2022-03-02 LAB — HEMOGLOBIN A1C
Est. average glucose Bld gHb Est-mCnc: 163 mg/dL
Hgb A1c MFr Bld: 7.3 % — ABNORMAL HIGH (ref 4.8–5.6)

## 2022-03-03 ENCOUNTER — Telehealth: Payer: Self-pay

## 2022-03-03 NOTE — Telephone Encounter (Signed)
The patient has been notified of the result and verbalized understanding.  All questions (if any) were answered.     

## 2022-03-03 NOTE — Telephone Encounter (Signed)
-----   Message from Jerline Pain, MD sent at 03/02/2022  6:16 AM EST ----- Normal lab work except for elevated A1c 7.3. Continue to work on diet and exercise in addition to taking your diabetes medication.   Candee Furbish, MD

## 2022-04-26 ENCOUNTER — Other Ambulatory Visit: Payer: Self-pay | Admitting: Cardiology

## 2022-05-07 ENCOUNTER — Other Ambulatory Visit: Payer: Self-pay | Admitting: Cardiology

## 2023-02-14 ENCOUNTER — Other Ambulatory Visit: Payer: Self-pay | Admitting: Cardiology

## 2023-04-11 ENCOUNTER — Encounter: Payer: Self-pay | Admitting: Cardiology

## 2023-04-11 ENCOUNTER — Ambulatory Visit: Payer: Medicare HMO | Attending: Cardiology | Admitting: Cardiology

## 2023-04-11 VITALS — BP 142/60 | HR 49 | Ht 66.0 in | Wt 207.6 lb

## 2023-04-11 DIAGNOSIS — E7800 Pure hypercholesterolemia, unspecified: Secondary | ICD-10-CM

## 2023-04-11 DIAGNOSIS — I10 Essential (primary) hypertension: Secondary | ICD-10-CM | POA: Diagnosis not present

## 2023-04-11 DIAGNOSIS — I2510 Atherosclerotic heart disease of native coronary artery without angina pectoris: Secondary | ICD-10-CM | POA: Diagnosis not present

## 2023-04-11 DIAGNOSIS — Z79899 Other long term (current) drug therapy: Secondary | ICD-10-CM | POA: Diagnosis not present

## 2023-04-11 NOTE — Patient Instructions (Signed)
 Medication Instructions:  Please discontinue your Metoprolol. Continue all other medications as listed.  *If you need a refill on your cardiac medications before your next appointment, please call your pharmacy*  Follow-Up: At [facility], you and your health needs are our priority.  As part of our continuing mission to provide you with exceptional heart care, we have created designated Provider Care Teams.  These Care Teams include your primary Cardiologist (physician) and Advanced Practice Providers (APPs -  Physician Assistants and Nurse Practitioners) who all work together to provide you with the care you need, when you need it.  We recommend signing up for the patient portal called "MyChart".  Sign up information is provided on this After Visit Summary.  MyChart is used to connect with patients for Virtual Visits (Telemedicine).  Patients are able to view lab/test results, encounter notes, upcoming appointments, etc.  Non-urgent messages can be sent to your provider as well.   To learn more about what you can do with MyChart, go to [email or web address].    Your next appointment:   1 year(s)  Provider:   Donato Schultz, MD       1st Floor: - Lobby - Registration  - Pharmacy  - Lab - Cafe  2nd Floor: - PV Lab - Diagnostic Testing (echo, CT, nuclear med)  3rd Floor: - Vacant  4th Floor: - TCTS (cardiothoracic surgery) - AFib Clinic - Structural Heart Clinic - Vascular Surgery  - Vascular Ultrasound  5th Floor: - HeartCare Cardiology (general and EP) - Clinical Pharmacy for coumadin, hypertension, lipid, weight-loss medications, and med management appointments    Valet parking services will be available as well.

## 2023-04-11 NOTE — Progress Notes (Signed)
 Cardiology Office Note:  .   Date:  04/11/2023  ID:  [name], DOB 31-Jan-1941, MRN [id number] PCP: Olive Bass, MD  [facility] Providers Cardiologist:  Donato Schultz, MD     History of Present Illness: .   [name] is a 83 y.o. male Discussed the use of AI scribe  History of Present Illness   [name] is an 83 year old male with coronary artery disease CABG and hyperlipidemia who presents for follow-up. He is accompanied by his daughter.  He has a history of coronary artery disease and underwent a four-vessel coronary artery bypass graft (CABG) on April 25, 2010. A nuclear stress test in 2017 was low risk and normal. An echocardiogram in 2012 showed an ejection fraction of 55%. No current chest pain or recent episodes of angina.  He has hyperlipidemia and is currently taking atorvastatin 40 mg daily. His LDL was 42 mg/dL, and triglycerides were 82 mg/dL as of March 01, 2022, from outside labs, indicating that his lipid levels are at goal.  He has hypertension and is taking lisinopril 20 mg daily. He was also taking metoprolol 12.5 mg twice daily, but this has been stopped due to bradycardia noted on a recent EKG, which showed a heart rate of 49 beats per minute.  He has diabetes with a recent hemoglobin A1c of 7.3%. He is not currently taking metformin, having stopped it due to concerns about side effects. He has been using highly concentrated ginger root since February of the previous year, which he reports has helped maintain his blood sugar levels, although he admits to consuming sugary foods and drinks occasionally.  He experiences increased daytime sleepiness, often taking naps after activities. His daughter notes that he snores and falls asleep quickly, suggesting possible sleep apnea. No recent falls causing injury but mentions occasional balance issues, particularly when stepping up or down.  He used to climb trees with his grandchildren but has  stopped this activity. He enjoys spending time with his family, including attending birthday parties, where he sometimes indulges in cake and Coca[employer]a.           Studies Reviewed: Marland Kitchen   EKG Interpretation Date/Time:  Monday April 11 2023 08:52:58 EST Ventricular Rate:  49 PR Interval:  152 QRS Duration:  86 QT Interval:  436 QTC Calculation: 393 R Axis:   8  Text Interpretation: Minimal voltage criteria for LVH, may be normal variant  Sinus bradycardia ( R in aVL ) T wave abnormality, consider inferior ischemia When compared with ECG of 28-Mar-2010 07:02, Rate slower Anterolateral leads Confirmed by Donato Schultz ([id number]) on 04/11/2023 9:16:01 AM    Results   LABS LDL: 42 mg/dL (19/14/7829) Triglycerides: 82 mg/dL (56/21/3086) Hemoglobin A1c: 7.3% (03/01/2022) Creatinine: 1.03 mg/dL (57/84/6962) Potassium: 4.5 mmol/L (03/01/2022) ALT: 22 U/L (03/01/2022)  DIAGNOSTIC Nuclear stress test: low risk, normal (2017) Echocardiography: EF 55% (2012) EKG: bradycardia, 49 bpm (04/11/2023)     Risk Assessment/Calculations:           Physical Exam:   VS:  BP (!) 142/60   Pulse (!) 49   Ht 5\' 6"  (1.676 m)   Wt 207 lb 9.6 oz (94.2 kg)   SpO2 95%   BMI 33.51 kg/m    Wt Readings from Last 3 Encounters:  04/11/23 207 lb 9.6 oz (94.2 kg)  03/01/22 205 lb (93 kg)  01/20/21 211 lb (95.7 kg)    GEN: Well nourished, well developed in no acute  distress NECK: No JVD; No carotid bruits CARDIAC: RRR, no murmurs, no rubs, no gallops RESPIRATORY:  Clear to auscultation without rales, wheezing or rhonchi  ABDOMEN: Soft, non-tender, non-distended EXTREMITIES:  No edema; No deformity   ASSESSMENT AND PLAN: .    Assessment and Plan    Coronary Artery Disease (CAD) Quadruple CABG in 2012. Current EKG shows bradycardia (HR 49 bpm). No chest pain. Atorvastatin 40 mg daily effectively managing LDL (42 mg/dL). Metoprolol discontinued to prevent further bradycardia and potential pacemaker  need. - Discontinue metoprolol 12.5 mg twice daily. - Continue atorvastatin 40 mg daily. - Continue aspirin as prescribed.  Hyperlipidemia Well-controlled with atorvastatin. LDL at goal (42 mg/dL) as of 16/11/9602. Triglycerides 82 mg/dL. - Continue atorvastatin 40 mg daily.  Hypertension Managed with lisinopril 20 mg daily. No recent BP readings provided. - Continue lisinopril 20 mg daily.  Diabetes Mellitus HbA1c 7.3%. Not on metformin, using ginger root as alternative treatment. Reports occasional hyperglycemia, especially postprandial with sugary foods/drinks. Discussed reducing sugary intake. - Discuss diabetes management with primary care physician, Dr. Sol Passer. - Encourage reduction of sugary foods and drinks.  General Health Maintenance Active but increased fatigue and daytime napping. Occasional falls possibly due to balance issues or environmental factors. Discussed potential sleep apnea and benefits of a home sleep study. - Consider home sleep study for sleep apnea. - Encourage safe physical activities and fall prevention strategies.  Follow-up - Schedule one-year follow-up appointment.               Signed, Donato Schultz, MD

## 2023-10-30 ENCOUNTER — Encounter (HOSPITAL_COMMUNITY): Payer: Self-pay | Admitting: *Deleted

## 2023-10-30 ENCOUNTER — Emergency Department (HOSPITAL_COMMUNITY)

## 2023-10-30 ENCOUNTER — Other Ambulatory Visit: Payer: Self-pay

## 2023-10-30 ENCOUNTER — Emergency Department (HOSPITAL_COMMUNITY)
Admission: EM | Admit: 2023-10-30 | Discharge: 2023-10-30 | Disposition: A | Discharge status: Home / Self Care | Attending: Emergency Medicine | Admitting: Emergency Medicine

## 2023-10-30 DIAGNOSIS — Z7982 Long term (current) use of aspirin: Secondary | ICD-10-CM | POA: Diagnosis not present

## 2023-10-30 DIAGNOSIS — I2510 Atherosclerotic heart disease of native coronary artery without angina pectoris: Secondary | ICD-10-CM | POA: Diagnosis not present

## 2023-10-30 DIAGNOSIS — I951 Orthostatic hypotension: Secondary | ICD-10-CM | POA: Diagnosis not present

## 2023-10-30 DIAGNOSIS — Z79899 Other long term (current) drug therapy: Secondary | ICD-10-CM | POA: Diagnosis not present

## 2023-10-30 DIAGNOSIS — Z7984 Long term (current) use of oral hypoglycemic drugs: Secondary | ICD-10-CM | POA: Diagnosis not present

## 2023-10-30 DIAGNOSIS — R55 Syncope and collapse: Secondary | ICD-10-CM | POA: Diagnosis present

## 2023-10-30 DIAGNOSIS — E119 Type 2 diabetes mellitus without complications: Secondary | ICD-10-CM | POA: Insufficient documentation

## 2023-10-30 DIAGNOSIS — E860 Dehydration: Secondary | ICD-10-CM | POA: Diagnosis not present

## 2023-10-30 LAB — I-STAT CHEM 8, ED
BUN: 24 mg/dL — ABNORMAL HIGH (ref 8–23)
Calcium, Ion: 1.14 mmol/L — ABNORMAL LOW (ref 1.15–1.40)
Chloride: 108 mmol/L (ref 98–111)
Creatinine, Ser: 1.5 mg/dL — ABNORMAL HIGH (ref 0.61–1.24)
Glucose, Bld: 118 mg/dL — ABNORMAL HIGH (ref 70–99)
HCT: 33 % — ABNORMAL LOW (ref 39.0–52.0)
Hemoglobin: 11.2 g/dL — ABNORMAL LOW (ref 13.0–17.0)
Potassium: 4.5 mmol/L (ref 3.5–5.1)
Sodium: 141 mmol/L (ref 135–145)
TCO2: 20 mmol/L — ABNORMAL LOW (ref 22–32)

## 2023-10-30 LAB — CBC
HCT: 34 % — ABNORMAL LOW (ref 39.0–52.0)
Hemoglobin: 11.6 g/dL — ABNORMAL LOW (ref 13.0–17.0)
MCH: 31 pg (ref 26.0–34.0)
MCHC: 34.1 g/dL (ref 30.0–36.0)
MCV: 90.9 fL (ref 80.0–100.0)
Platelets: 217 K/uL (ref 150–400)
RBC: 3.74 MIL/uL — ABNORMAL LOW (ref 4.22–5.81)
RDW: 14.1 % (ref 11.5–15.5)
WBC: 4.8 K/uL (ref 4.0–10.5)
nRBC: 0 % (ref 0.0–0.2)

## 2023-10-30 LAB — HEPATIC FUNCTION PANEL
ALT: 27 U/L (ref 0–44)
AST: 23 U/L (ref 15–41)
Albumin: 3.7 g/dL (ref 3.5–5.0)
Alkaline Phosphatase: 65 U/L (ref 38–126)
Bilirubin, Direct: 0.2 mg/dL (ref 0.0–0.2)
Indirect Bilirubin: 0.9 mg/dL (ref 0.3–0.9)
Total Bilirubin: 1.1 mg/dL (ref 0.0–1.2)
Total Protein: 7.4 g/dL (ref 6.5–8.1)

## 2023-10-30 LAB — TROPONIN I (HIGH SENSITIVITY)
Troponin I (High Sensitivity): 14 ng/L (ref <18)
Troponin I (High Sensitivity): 20 ng/L — ABNORMAL HIGH (ref <18)

## 2023-10-30 LAB — CBG MONITORING, ED: Glucose-Capillary: 105 mg/dL — ABNORMAL HIGH (ref 70–99)

## 2023-10-30 LAB — BASIC METABOLIC PANEL WITH GFR
Anion gap: 10 (ref 5–15)
BUN: 23 mg/dL (ref 8–23)
CO2: 20 mmol/L — ABNORMAL LOW (ref 22–32)
Calcium: 8.9 mg/dL (ref 8.9–10.3)
Chloride: 107 mmol/L (ref 98–111)
Creatinine, Ser: 1.27 mg/dL — ABNORMAL HIGH (ref 0.61–1.24)
GFR, Estimated: 56 mL/min — ABNORMAL LOW (ref >60)
Glucose, Bld: 110 mg/dL — ABNORMAL HIGH (ref 70–99)
Potassium: 4.4 mmol/L (ref 3.5–5.1)
Sodium: 137 mmol/L (ref 135–145)

## 2023-10-30 MED ORDER — SODIUM CHLORIDE 0.9 % IV BOLUS
500.0000 mL | Freq: Once | INTRAVENOUS | Status: AC
  Administered 2023-10-30: 500 mL via INTRAVENOUS

## 2023-10-30 NOTE — ED Notes (Signed)
 CCMD called to place the patient on cardiac monitoring services.

## 2023-10-30 NOTE — ED Notes (Signed)
 Pt ambulated with an even and steady gait. No episodes of light headed or dizziness.

## 2023-10-30 NOTE — ED Provider Notes (Signed)
 [place] EMERGENCY DEPARTMENT AT [facility] Provider Note   CSN: [id number] Arrival date & time: 10/30/23  1539     Patient presents with: Near Syncope and Dizziness   [name] is a 83 y.o. male.   Pt is a 83 yo male with pmhx significant for obesity, DM2 (was on metformin, but he took himself off and is taking ginger), HLD, and CAD.  Pt bent over to pick something up off the floor and became dizzy and passed out.  No injury.  His daughter checked his bs and bp and they were ok.  Pt was very sweaty initially.  He feels back to normal now.         Prior to Admission medications   Medication Sig Start Date End Date Taking? Authorizing Provider  aspirin  81 MG tablet Take 1 tablet (81 mg total) by mouth daily. 03/20/14   Jeffrie Oneil BROCKS, MD  atorvastatin  (LIPITOR) 40 MG tablet TAKE 1 TABLET EVERY DAY. 04/27/22   Jeffrie Oneil BROCKS, MD  Blood Glucose Monitoring Suppl (ONE TOUCH ULTRA MINI) W/DEVICE KIT  03/09/13   [provider]  CETIRIZINE HCL PO Apply 1 application topically as needed for itching. 07/28/17   [provider]  lisinopril  (ZESTRIL ) 20 MG tablet Take 1 tablet (20 mg total) by mouth daily. 05/07/22   Jeffrie Oneil BROCKS, MD  metFORMIN (GLUCOPHAGE) 1000 MG tablet Take 1,000 mg by mouth 2 (two) times daily with a meal.  Patient not taking: Reported on 04/11/2023 01/03/14   [provider]  ONE TOUCH ULTRA TEST test strip 1 each by Other route daily.  03/09/13   [provider]  AISHA PASTOR LANCETS FINE MISC 1 each daily.  03/09/13   [provider]    Allergies: Patient has no known allergies.    Review of Systems  Neurological:  Positive for syncope.  All other systems reviewed and are negative.   Updated Vital Signs BP (!) 184/132 (BP Location: Right Arm)   Pulse 66   Temp (!) 97.5 F (36.4 C) (Axillary)   Resp 19   Ht 5' 6 (1.676 m)   Wt 94.2 kg   SpO2 100%   BMI 33.52 kg/m   Physical Exam Vitals and  nursing note reviewed.  Constitutional:      Appearance: Normal appearance. He is obese.  HENT:     Head: Normocephalic and atraumatic.     Right Ear: External ear normal.     Left Ear: External ear normal.     Nose: Nose normal.     Mouth/Throat:     Mouth: Mucous membranes are moist.     Pharynx: Oropharynx is clear.  Eyes:     Extraocular Movements: Extraocular movements intact.     Conjunctiva/sclera: Conjunctivae normal.     Pupils: Pupils are equal, round, and reactive to light.  Cardiovascular:     Rate and Rhythm: Normal rate and regular rhythm.     Pulses: Normal pulses.     Heart sounds: Normal heart sounds.  Pulmonary:     Effort: Pulmonary effort is normal.     Breath sounds: Normal breath sounds.  Abdominal:     General: Abdomen is flat. Bowel sounds are normal.     Palpations: Abdomen is soft.  Musculoskeletal:        General: Normal range of motion.     Cervical back: Normal range of motion and neck supple.  Skin:    General: Skin is  warm.     Capillary Refill: Capillary refill takes less than 2 seconds.  Neurological:     General: No focal deficit present.     Mental Status: He is alert and oriented to person, place, and time.  Psychiatric:        Mood and Affect: Mood normal.        Behavior: Behavior normal.     (all labs ordered are listed, but only abnormal results are displayed) Labs Reviewed  BASIC METABOLIC PANEL WITH GFR - Abnormal; Notable for the following components:      Result Value   CO2 20 (*)    Glucose, Bld 110 (*)    Creatinine, Ser 1.27 (*)    GFR, Estimated 56 (*)    All other components within normal limits  CBC - Abnormal; Notable for the following components:   RBC 3.74 (*)    Hemoglobin 11.6 (*)    HCT 34.0 (*)    All other components within normal limits  I-STAT CHEM 8, ED - Abnormal; Notable for the following components:   BUN 24 (*)    Creatinine, Ser 1.50 (*)    Glucose, Bld 118 (*)    Calcium , Ion 1.14 (*)    TCO2  20 (*)    Hemoglobin 11.2 (*)    HCT 33.0 (*)    All other components within normal limits  CBG MONITORING, ED - Abnormal; Notable for the following components:   Glucose-Capillary 105 (*)    All other components within normal limits  TROPONIN I (HIGH SENSITIVITY) - Abnormal; Notable for the following components:   Troponin I (High Sensitivity) 20 (*)    All other components within normal limits  HEPATIC FUNCTION PANEL  TROPONIN I (HIGH SENSITIVITY)    EKG: EKG Interpretation Date/Time:  Sunday October 30 2023 16:20:49 EDT Ventricular Rate:  61 PR Interval:  142 QRS Duration:  103 QT Interval:  402 QTC Calculation: 405 R Axis:   14  Text Interpretation: Sinus rhythm No significant change since last tracing Confirmed by Dean Clarity [phone]) on 10/30/2023 4:45:13 PM  Radiology: CT HEAD WO CONTRAST Result Date: 10/30/2023 CLINICAL DATA:  Headache EXAM: CT HEAD WITHOUT CONTRAST TECHNIQUE: Contiguous axial images were obtained from the base of the skull through the vertex without intravenous contrast. RADIATION DOSE REDUCTION: This exam was performed according to the departmental dose-optimization program which includes automated exposure control, adjustment of the mA and/or kV according to patient size and/or use of iterative reconstruction technique. COMPARISON:  None Available. FINDINGS: Brain: No acute intracranial abnormality. Specifically, no hemorrhage, hydrocephalus, mass lesion, acute infarction, or significant intracranial injury. Vascular: No hyperdense vessel or unexpected calcification. Skull: No acute calvarial abnormality. Sinuses/Orbits: From the material within the left maxillary sinus. The remainder of the paranasal sinuses are clear. Other: None IMPRESSION: No acute intracranial abnormality. Frothy material within the left maxillary sinus could reflect sinusitis. Electronically Signed   By: Franky Crease M.D.   On: 10/30/2023 18:02   DG Chest 2 View Result Date:  10/30/2023 CLINICAL DATA:  Near syncope. EXAM: CHEST - 2 VIEW COMPARISON:  April 21, 2010. FINDINGS: Stable cardiomediastinal silhouette. Status post coronary artery bypass graft. Both lungs are clear. The visualized skeletal structures are unremarkable. IMPRESSION: No active cardiopulmonary disease. Electronically Signed   By: Lynwood Landy Raddle M.D.   On: 10/30/2023 16:51     Procedures   Medications Ordered in the ED  sodium chloride  0.9 % bolus 500 mL (500 mLs Intravenous New  Bag/Given 10/30/23 1657)                                    Medical Decision Making Amount and/or Complexity of Data Reviewed Labs: ordered. Radiology: ordered.   This patient presents to the ED for concern of syncope, this involves an extensive number of treatment options, and is a complaint that carries with it a high risk of complications and morbidity.  The differential diagnosis includes orthostatic, vasovagal, cardiogenic   Co morbidities that complicate the patient evaluation  obesity, DM2 (was on metformin, but he took himself off and is taking ginger), HLD, and CAD   Additional history obtained:  Additional history obtained from epic chart review External records from outside source obtained and reviewed including family   Lab Tests:  I Ordered, and personally interpreted labs.  The pertinent results include:  cbc with hgb 11.6, bmp nl other than cr 1.27 (cr 1.03 last year); trop 14; lfts nl   Imaging Studies ordered:  I ordered imaging studies including cxr, ct head  I independently visualized and interpreted imaging which showed  CXR: No active cardiopulmonary disease.  CT head: No acute intracranial abnormality.    Frothy material within the left maxillary sinus could reflect  sinusitis.   I agree with the radiologist interpretation   Cardiac Monitoring:  The patient was maintained on a cardiac monitor.  I personally viewed and interpreted the cardiac monitored which showed an  underlying rhythm of: nsr   Medicines ordered and prescription drug management:  I ordered medication including ivfs  for sx  Reevaluation of the patient after these medicines showed that the patient improved I have reviewed the patients home medicines and have made adjustments as needed   Test Considered:  ct   Critical Interventions:  ivfs  Problem List / ED Course:  Syncope:  I suspect orthostatic as sx occurred after bending over and standing up.  He feels well now.  He is able to ambulate without problems.  He is stable for d/c.  Return if worse.  F/u with pcp.   Reevaluation:  After the interventions noted above, I reevaluated the patient and found that they have :improved   Social Determinants of Health:  Lives at home   Dispostion:  After consideration of the diagnostic results and the patients response to treatment, I feel that the patent would benefit from discharge with outpatient f/u.       Final diagnoses:  Dehydration  Orthostatic hypotension    ED Discharge Orders     None          Dean Clarity, MD 10/30/23 [place]

## 2023-10-30 NOTE — ED Triage Notes (Signed)
 The pt had a syncopal episode earlier when he bended over to pick something from the floor became dizzy   no nausea  no hx of the same

## 2023-10-30 NOTE — ED Triage Notes (Signed)
 C/o a headache since last pm

## 2023-10-30 NOTE — ED Notes (Signed)
 Pt provided discharge instructions and prescription information. Pt was given the opportunity to ask questions and questions were answered.

## 2023-10-30 NOTE — ED Notes (Signed)
 CCMD called to report patient going to CT.

## 2024-06-14 ENCOUNTER — Ambulatory Visit: Admitting: Cardiology
